# Patient Record
Sex: Female | Born: 1965 | Race: Black or African American | Hispanic: No | Marital: Single | Smoking: Never smoker
Health system: Southern US, Community
[De-identification: ages and names within clinical notes are randomized; demographics above are authoritative.]

## PROBLEM LIST (undated history)

## (undated) DIAGNOSIS — T783XXA Angioneurotic edema, initial encounter: Secondary | ICD-10-CM

## (undated) DIAGNOSIS — T7840XA Allergy, unspecified, initial encounter: Secondary | ICD-10-CM

## (undated) DIAGNOSIS — L509 Urticaria, unspecified: Secondary | ICD-10-CM

## (undated) HISTORY — PX: REDUCTION MAMMAPLASTY: SUR839

## (undated) HISTORY — DX: Allergy, unspecified, initial encounter: T78.40XA

## (undated) HISTORY — DX: Angioneurotic edema, initial encounter: T78.3XXA

## (undated) HISTORY — DX: Urticaria, unspecified: L50.9

---

## 1987-03-11 HISTORY — PX: BREAST REDUCTION SURGERY: SHX8

## 1991-03-11 HISTORY — PX: WISDOM TOOTH EXTRACTION: SHX21

## 2019-08-03 ENCOUNTER — Other Ambulatory Visit: Payer: Self-pay

## 2019-08-03 ENCOUNTER — Ambulatory Visit (INDEPENDENT_AMBULATORY_CARE_PROVIDER_SITE_OTHER): Payer: Self-pay | Admitting: Nurse Practitioner

## 2019-08-03 ENCOUNTER — Encounter: Payer: Self-pay | Admitting: Nurse Practitioner

## 2019-08-03 VITALS — BP 169/86 | HR 62 | Temp 98.5°F | Ht 62.0 in | Wt 268.4 lb

## 2019-08-03 DIAGNOSIS — Z0000 Encounter for general adult medical examination without abnormal findings: Secondary | ICD-10-CM | POA: Diagnosis not present

## 2019-08-03 DIAGNOSIS — R319 Hematuria, unspecified: Secondary | ICD-10-CM

## 2019-08-03 DIAGNOSIS — Z6841 Body Mass Index (BMI) 40.0 and over, adult: Secondary | ICD-10-CM

## 2019-08-03 DIAGNOSIS — R030 Elevated blood-pressure reading, without diagnosis of hypertension: Secondary | ICD-10-CM

## 2019-08-03 DIAGNOSIS — R7303 Prediabetes: Secondary | ICD-10-CM

## 2019-08-03 DIAGNOSIS — J301 Allergic rhinitis due to pollen: Secondary | ICD-10-CM

## 2019-08-03 DIAGNOSIS — R82998 Other abnormal findings in urine: Secondary | ICD-10-CM

## 2019-08-03 DIAGNOSIS — R198 Other specified symptoms and signs involving the digestive system and abdomen: Secondary | ICD-10-CM

## 2019-08-03 LAB — POCT URINALYSIS DIPSTICK
Bilirubin, UA: NEGATIVE
Glucose, UA: NEGATIVE
Ketones, UA: NEGATIVE
Nitrite, UA: NEGATIVE
Protein, UA: NEGATIVE
Spec Grav, UA: 1.02 (ref 1.010–1.025)
Urobilinogen, UA: 1 E.U./dL
pH, UA: 7 (ref 5.0–8.0)

## 2019-08-03 LAB — POCT GLYCOSYLATED HEMOGLOBIN (HGB A1C): Hemoglobin A1C: 6.1 % — AB (ref 4.0–5.6)

## 2019-08-03 LAB — GLUCOSE, POCT (MANUAL RESULT ENTRY): POC Glucose: 101 mg/dl — AB (ref 70–99)

## 2019-08-03 NOTE — Patient Instructions (Addendum)
   Managing Your Hypertension Hypertension is commonly called high blood pressure. This is when the force of your blood pressing against the walls of your arteries is too strong. Arteries are blood vessels that carry blood from your heart throughout your body. Hypertension forces the heart to work harder to pump blood, and may cause the arteries to become narrow or stiff. Having untreated or uncontrolled hypertension can cause heart attack, stroke, kidney disease, and other problems. What are blood pressure readings? A blood pressure reading consists of a higher number over a lower number. Ideally, your blood pressure should be below 120/80. The first ("top") number is called the systolic pressure. It is a measure of the pressure in your arteries as your heart beats. The second ("bottom") number is called the diastolic pressure. It is a measure of the pressure in your arteries as the heart relaxes. What does my blood pressure reading mean? Blood pressure is classified into four stages. Based on your blood pressure reading, your health care provider may use the following stages to determine what type of treatment you need, if any. Systolic pressure and diastolic pressure are measured in a unit called mm Hg. Normal  Systolic pressure: below 120.  Diastolic pressure: below 80. Elevated  Systolic pressure: 120-129.  Diastolic pressure: below 80. Hypertension stage 1  Systolic pressure: 130-139.  Diastolic pressure: 80-89. Hypertension stage 2  Systolic pressure: 140 or above.  Diastolic pressure: 90 or above. What health risks are associated with hypertension? Managing your hypertension is an important responsibility. Uncontrolled hypertension can lead to:  A heart attack.  A stroke.  A weakened blood vessel (aneurysm).  Heart failure.  Kidney damage.  Eye damage.  Metabolic syndrome.  Memory and concentration problems. What changes can I make to manage my  hypertension? Hypertension can be managed by making lifestyle changes and possibly by taking medicines. Your health care provider will help you make a plan to bring your blood pressure within a normal range. Eating and drinking   Eat a diet that is high in fiber and potassium, and low in salt (sodium), added sugar, and fat. An example eating plan is called the DASH (Dietary Approaches to Stop Hypertension) diet. To eat this way: ? Eat plenty of fresh fruits and vegetables. Try to fill half of your plate at each meal with fruits and vegetables. ? Eat whole grains, such as whole wheat pasta, brown rice, or whole grain bread. Fill about one quarter of your plate with whole grains. ? Eat low-fat diary products. ? Avoid fatty cuts of meat, processed or cured meats, and poultry with skin. Fill about one quarter of your plate with lean proteins such as fish, chicken without skin, beans, eggs, and tofu. ? Avoid premade and processed foods. These tend to be higher in sodium, added sugar, and fat.  Reduce your daily sodium intake. Most people with hypertension should eat less than 1,500 mg of sodium a day.  Limit alcohol intake to no more than 1 drink a day for nonpregnant women and 2 drinks a day for men. One drink equals 12 oz of beer, 5 oz of wine, or 1 oz of hard liquor. Lifestyle  Work with your health care provider to maintain a healthy body weight, or to lose weight. Ask what an ideal weight is for you.  Get at least 30 minutes of exercise that causes your heart to beat faster (aerobic exercise) most days of the week. Activities may include walking, swimming, or biking.    Include exercise to strengthen your muscles (resistance exercise), such as weight lifting, as part of your weekly exercise routine. Try to do these types of exercises for 30 minutes at least 3 days a week.  Do not use any products that contain nicotine or tobacco, such as cigarettes and e-cigarettes. If you need help quitting,  ask your health care provider.  Control any long-term (chronic) conditions you have, such as high cholesterol or diabetes. Monitoring  Monitor your blood pressure at home as told by your health care provider. Your personal target blood pressure may vary depending on your medical conditions, your age, and other factors.  Have your blood pressure checked regularly, as often as told by your health care provider. Working with your health care provider  Review all the medicines you take with your health care provider because there may be side effects or interactions.  Talk with your health care provider about your diet, exercise habits, and other lifestyle factors that may be contributing to hypertension.  Visit your health care provider regularly. Your health care provider can help you create and adjust your plan for managing hypertension. Will I need medicine to control my blood pressure? Your health care provider may prescribe medicine if lifestyle changes are not enough to get your blood pressure under control, and if:  Your systolic blood pressure is 130 or higher.  Your diastolic blood pressure is 80 or higher. Take medicines only as told by your health care provider. Follow the directions carefully. Blood pressure medicines must be taken as prescribed. The medicine does not work as well when you skip doses. Skipping doses also puts you at risk for problems. Contact a health care provider if:  You think you are having a reaction to medicines you have taken.  You have repeated (recurrent) headaches.  You feel dizzy.  You have swelling in your ankles.  You have trouble with your vision. Get help right away if:  You develop a severe headache or confusion.  You have unusual weakness or numbness, or you feel faint.  You have severe pain in your chest or abdomen.  You vomit repeatedly.  You have trouble breathing. Summary  Hypertension is when the force of blood pumping  through your arteries is too strong. If this condition is not controlled, it may put you at risk for serious complications.  Your personal target blood pressure may vary depending on your medical conditions, your age, and other factors. For most people, a normal blood pressure is less than 120/80.  Hypertension is managed by lifestyle changes, medicines, or both. Lifestyle changes include weight loss, eating a healthy, low-sodium diet, exercising more, and limiting alcohol. This information is not intended to replace advice given to you by your health care provider. Make sure you discuss any questions you have with your health care provider. Document Revised: 06/18/2018 Document Reviewed: 01/23/2016 Elsevier Patient Education  2020 [employer]vier Inc.  

## 2019-08-03 NOTE — Progress Notes (Signed)
[facility] Patient [facility] [address] [place], [place]  [address] Phone:  [phone]   Fax:  [phone]   New Patient Office Visit  Subjective:  Patient ID: Erica Ferguson, female    DOB: February 21, 1966  Age: 54 y.o. MRN: [id number]  CC:  Chief Complaint  Patient presents with  . New Patient (Initial Visit)    Est care    HPI Erica Ferguson presents to establish care. She  has no past medical history on file.  She moved her from [place] just prior to the pandemic. She is up-to-date on Mammogram and Pap smear completed just prior to her move. She admits that she is not on any medications. She has recently started allergy medication do to the change in environment.   Hypertension Patient has elevated blood pressure. She is exercising and is adherent to a low-salt diet. She has lost 8 pounds so she is improving. She has a goal of being under 200 pounds. Blood pressure is well controlled at home. Cardiac symptoms: exertional chest pressure/discomfort. She has shortness of breath with exercises. She has a history of plantar fascitis. She is doing better.Patient denies claudication, fatigue, irregular heart beat, lower extremity edema, orthopnea and syncope. Cardiovascular risk factors: obesity (BMI >= 30 kg/m2) and sedentary lifestyle. Use of agents associated with hypertension: none. History of target organ damage: none. She wears glasses.    Constipation Patient complains of constipation. Onset was several months  Co-Morbid conditions:obesity. Symptoms have been intermittent.She has some fullness. She admits that she can have fullness with and without meals.  Current Health Habits: Eating fiber? yes - , Exercise? yes - . Adequate hydration? yes - . Current over the counter/prescription laxative: which has been not very effective. She denies colonoscopy. She admits that TSH has been normal.    No past medical history on file.  Past Surgical History:  Procedure Laterality Date  .  BREAST REDUCTION SURGERY  1989    Family History  Problem Relation Age of Onset  . Kidney failure Mother   . Hypertension Mother     Social History   Socioeconomic History  . Marital status: Single    Spouse name: Not on file  . Number of children: Not on file  . Years of education: Not on file  . Highest education level: Not on file  Occupational History  . Not on file  Tobacco Use  . Smoking status: Never Smoker  . Smokeless tobacco: Never Used  Substance and Sexual Activity  . Alcohol use: Not Currently  . Drug use: Never  . Sexual activity: Not Currently  Other Topics Concern  . Not on file  Social History Narrative  . Not on file   Social Determinants of Health   Financial Resource Strain:   . Difficulty of Paying Living Expenses:   Food Insecurity:   . Worried About Programme researcher, broadcasting/film/video in the Last Year:   . Barista in the Last Year:   Transportation Needs:   . Freight forwarder (Medical):   Marland Kitchen Lack of Transportation (Non-Medical):   Physical Activity:   . Days of Exercise per Week:   . Minutes of Exercise per Session:   Stress:   . Feeling of Stress :   Social Connections:   . Frequency of Communication with Friends and Family:   . Frequency of Social Gatherings with Friends and Family:   . Attends Religious Services:   . Active Member of  Clubs or Organizations:   . Attends Archivist Meetings:   Marland Kitchen Marital Status:   Intimate Partner Violence:   . Fear of Current or Ex-Partner:   . Emotionally Abused:   Marland Kitchen Physically Abused:   . Sexually Abused:     ROS Review of Systems  All other systems reviewed and are negative.   Objective:   Today's Vitals: BP (!) 169/86   Pulse 62   Temp 98.5 F (36.9 C)   Ht 5\' 2"  (1.575 m)   Wt 268 lb 6.4 oz (121.7 kg)   SpO2 100%   BMI 49.09 kg/m   Physical Exam Constitutional:      General: She is not in acute distress.    Appearance: She is obese. She is not ill-appearing or  toxic-appearing.  HENT:     Head: Normocephalic and atraumatic.     Nose: Nose normal.     Mouth/Throat:     Mouth: Mucous membranes are moist.     Pharynx: Oropharynx is clear.  Cardiovascular:     Rate and Rhythm: Normal rate and regular rhythm.     Pulses: Normal pulses.     Heart sounds: Normal heart sounds.  Pulmonary:     Effort: Pulmonary effort is normal.     Breath sounds: Normal breath sounds.  Abdominal:     Comments: hypoactive  Musculoskeletal:     Cervical back: Normal range of motion.  Skin:    General: Skin is warm and dry.     Capillary Refill: Capillary refill takes less than 2 seconds.  Neurological:     General: No focal deficit present.     Mental Status: She is alert and oriented to person, place, and time.  Psychiatric:        Mood and Affect: Mood normal.        Behavior: Behavior normal.        Thought Content: Thought content normal.        Judgment: Judgment normal.     Assessment & Plan:   Problem List Items Addressed This Visit    None    Visit Diagnoses    Morbid obesity with BMI of 45.0-49.9, adult ([place])    -  Primary   Lifestyle modification   Health care maintenance       GI referrral for colonoscopy   Relevant Orders   POCT urinalysis dipstick (Completed)   POCT glycosylated hemoglobin (Hb A1C) (Completed)   POCT glucose (manual entry) (Completed)   Ambulatory referral to Gastroenterology   CBC with Differential/Platelet (Completed)   Comp. Metabolic Panel (12) (Completed)   Lipid panel (Completed)   TSH (Completed)   Vitamin B12 (Completed)   Magnesium (Completed)   Prediabetes       A1C  Lifestyle modification  Written education provided   Elevated blood pressure reading       Patient to monitor  Continue weight loss and exercise    Leukocytes in urine       Relevant Orders   Urine Culture   Hematuria, unspecified type       Urine culture pending Will continue to monitor   Seasonal allergic rhinitis due to pollen        Continue current regimen Changes available if needed      Outpatient Encounter Medications as of 08/03/2019  Medication Sig  . loratadine (CLARITIN) 10 MG tablet Take 10 mg by mouth daily.  . vitamin C (ASCORBIC ACID) 250 MG tablet Take 250 mg  by mouth daily.   No facility-administered encounter medications on file as of 08/03/2019.    Follow-up: Return in about 3 months (around 11/03/2019) for fasting labs within the next week.   Barbette Merino, NP

## 2019-08-04 LAB — VITAMIN B12: Vitamin B-12: 175 pg/mL — ABNORMAL LOW (ref 232–1245)

## 2019-08-04 LAB — LIPID PANEL
Chol/HDL Ratio: 4 ratio (ref 0.0–4.4)
Cholesterol, Total: 185 mg/dL (ref 100–199)
HDL: 46 mg/dL (ref >39)
LDL Chol Calc (NIH): 122 mg/dL — ABNORMAL HIGH (ref 0–99)
Triglycerides: 94 mg/dL (ref 0–149)
VLDL Cholesterol Cal: 17 mg/dL (ref 5–40)

## 2019-08-04 LAB — COMP. METABOLIC PANEL (12)
AST: 13 IU/L (ref 0–40)
Albumin/Globulin Ratio: 1.1 — ABNORMAL LOW (ref 1.2–2.2)
Albumin: 4 g/dL (ref 3.8–4.9)
Alkaline Phosphatase: 63 IU/L (ref 48–121)
BUN/Creatinine Ratio: 10 (ref 9–23)
BUN: 9 mg/dL (ref 6–24)
Bilirubin Total: 0.5 mg/dL (ref 0.0–1.2)
Calcium: 9.2 mg/dL (ref 8.7–10.2)
Chloride: 101 mmol/L (ref 96–106)
Creatinine, Ser: 0.89 mg/dL (ref 0.57–1.00)
GFR calc Af Amer: 84 mL/min/{1.73_m2} (ref >59)
GFR calc non Af Amer: 73 mL/min/{1.73_m2} (ref >59)
Globulin, Total: 3.5 g/dL (ref 1.5–4.5)
Glucose: 92 mg/dL (ref 65–99)
Potassium: 4.2 mmol/L (ref 3.5–5.2)
Sodium: 138 mmol/L (ref 134–144)
Total Protein: 7.5 g/dL (ref 6.0–8.5)

## 2019-08-04 LAB — CBC WITH DIFFERENTIAL/PLATELET
Basophils Absolute: 0.1 10*3/uL (ref 0.0–0.2)
Basos: 1 %
EOS (ABSOLUTE): 0.1 10*3/uL (ref 0.0–0.4)
Eos: 2 %
Hematocrit: 42.5 % (ref 34.0–46.6)
Hemoglobin: 14.5 g/dL (ref 11.1–15.9)
Immature Grans (Abs): 0 10*3/uL (ref 0.0–0.1)
Immature Granulocytes: 0 %
Lymphocytes Absolute: 3 10*3/uL (ref 0.7–3.1)
Lymphs: 43 %
MCH: 30.3 pg (ref 26.6–33.0)
MCHC: 34.1 g/dL (ref 31.5–35.7)
MCV: 89 fL (ref 79–97)
Monocytes Absolute: 0.5 10*3/uL (ref 0.1–0.9)
Monocytes: 7 %
Neutrophils Absolute: 3.2 10*3/uL (ref 1.4–7.0)
Neutrophils: 47 %
Platelets: 299 10*3/uL (ref 150–450)
RBC: 4.79 x10E6/uL (ref 3.77–5.28)
RDW: 13.3 % (ref 11.7–15.4)
WBC: 6.9 10*3/uL (ref 3.4–10.8)

## 2019-08-04 LAB — MAGNESIUM: Magnesium: 2.2 mg/dL (ref 1.6–2.3)

## 2019-08-04 LAB — TSH: TSH: 0.779 u[IU]/mL (ref 0.450–4.500)

## 2019-08-04 NOTE — Progress Notes (Signed)
Labs overall are within normal range. Vitamin B12 insuffiencey recommend daily otc vit b12 will reevaluate in 3 mons. Continue with lifestyle modifications.

## 2019-08-05 LAB — URINE CULTURE

## 2019-08-12 ENCOUNTER — Other Ambulatory Visit: Payer: Self-pay

## 2019-09-06 ENCOUNTER — Encounter: Payer: Self-pay | Admitting: Internal Medicine

## 2019-10-05 ENCOUNTER — Other Ambulatory Visit: Payer: Self-pay

## 2019-10-05 ENCOUNTER — Ambulatory Visit (AMBULATORY_SURGERY_CENTER): Payer: Self-pay

## 2019-10-05 VITALS — Ht 62.0 in | Wt 270.0 lb

## 2019-10-05 DIAGNOSIS — Z01818 Encounter for other preprocedural examination: Secondary | ICD-10-CM

## 2019-10-05 DIAGNOSIS — Z1211 Encounter for screening for malignant neoplasm of colon: Secondary | ICD-10-CM

## 2019-10-05 MED ORDER — NA SULFATE-K SULFATE-MG SULF 17.5-3.13-1.6 GM/177ML PO SOLN: 1.0000 | Freq: Once | ORAL | 0 refills | Status: AC

## 2019-10-05 MED FILL — SUPREP BOWEL PREP KIT: 17.5-3.13-1 | 2 days supply | Qty: 354 | Fill #0

## 2019-10-05 NOTE — Progress Notes (Signed)
No egg or soy allergy known to patient  No issues with past sedation with any surgeries or procedures No intubation problems in the past  No diet pills per patient No home 02 use per patient  No blood thinners per patient  Pt HAS issues with constipation- No A fib or A flutter  EMMI video to pt or MyChart  COVID 19 guidelines implemented in [place] today   COVID screening scheduled on 10/25/2019 at 8:00 am;  Due to the COVID-19 pandemic we are asking patients to follow these guidelines. Please only bring one care partner. Please be aware that your care partner may wait in the car in the parking lot or if they feel like they will be too hot to wait in the car, they may wait in the lobby on the 4th floor. All care partners are required to wear a mask the entire time (we do not have any that we can provide them), they need to practice social distancing, and we will do a Covid check for all patient's and care partners when you arrive. Also we will check their temperature and your temperature. If the care partner waits in their car they need to stay in the parking lot the entire time and we will call them on their cell phone when the patient is ready for discharge so they can bring the car to the front of the building. Also all patient's will need to wear a mask into building.

## 2019-10-06 ENCOUNTER — Encounter: Payer: Self-pay | Admitting: Internal Medicine

## 2019-10-21 MED FILL — SUPREP BOWEL PREP KIT: 17.5-3.13-1 | 2 days supply | Qty: 354 | Fill #0

## 2019-10-21 MED FILL — SUPREP BOWEL PREP KIT: 17.5-3.13-1 | 2 days supply | Qty: 354 | Fill #0 | Status: TO

## 2019-10-25 ENCOUNTER — Other Ambulatory Visit: Payer: Self-pay

## 2019-10-25 ENCOUNTER — Other Ambulatory Visit: Payer: Self-pay | Admitting: Internal Medicine

## 2019-10-25 ENCOUNTER — Ambulatory Visit (INDEPENDENT_AMBULATORY_CARE_PROVIDER_SITE_OTHER): Payer: 59

## 2019-10-25 DIAGNOSIS — Z1159 Encounter for screening for other viral diseases: Secondary | ICD-10-CM

## 2019-10-25 LAB — SARS CORONAVIRUS 2 (TAT 6-24 HRS): SARS Coronavirus 2: NEGATIVE

## 2019-10-27 ENCOUNTER — Encounter: Payer: Self-pay | Admitting: Internal Medicine

## 2019-10-27 ENCOUNTER — Other Ambulatory Visit: Payer: Self-pay

## 2019-10-27 ENCOUNTER — Ambulatory Visit (AMBULATORY_SURGERY_CENTER): Payer: 59 | Admitting: Internal Medicine

## 2019-10-27 VITALS — BP 152/70 | HR 48 | Temp 98.0°F | Resp 16 | Ht 62.0 in | Wt 270.0 lb

## 2019-10-27 DIAGNOSIS — Z1211 Encounter for screening for malignant neoplasm of colon: Secondary | ICD-10-CM | POA: Diagnosis not present

## 2019-10-27 MED ORDER — SODIUM CHLORIDE 0.9 % IV SOLN: 500.0000 mL | Freq: Once | INTRAVENOUS | Status: DC

## 2019-10-27 NOTE — Op Note (Signed)
[facility] Patient Name: Erica Ferguson Procedure Date: 10/27/2019 1:31 PM MRN: [id number] Endoscopist: Wilhemina Bonito. Marina Goodell , MD Age: 54 Referring MD:  Date of Birth: Oct 13, 1965 Gender: Female Account #: [id number] Procedure:                Colonoscopy Indications:              Screening for colorectal malignant neoplasm Medicines:                Monitored Anesthesia Care Procedure:                Pre-Anesthesia Assessment:                           - Prior to the procedure, a History and Physical                            was performed, and patient medications and                            allergies were reviewed. The patient's tolerance of                            previous anesthesia was also reviewed. The risks                            and benefits of the procedure and the sedation                            options and risks were discussed with the patient.                            All questions were answered, and informed consent                            was obtained. Prior Anticoagulants: The patient has                            taken no previous anticoagulant or antiplatelet                            agents. ASA Grade Assessment: II - A patient with                            mild systemic disease. After reviewing the risks                            and benefits, the patient was deemed in                            satisfactory condition to undergo the procedure.                           After obtaining informed consent, the colonoscope  was passed under direct vision. Throughout the                            procedure, the patient's blood pressure, pulse, and                            oxygen saturations were monitored continuously. The                            Colonoscope was introduced through the anus and                            advanced to the the cecum, identified by                            appendiceal orifice and  ileocecal valve. The                            ileocecal valve, appendiceal orifice, and rectum                            were photographed. The quality of the bowel                            preparation was excellent. The colonoscopy was                            performed without difficulty. The patient tolerated                            the procedure well. The bowel preparation used was                            SUPREP via split dose instruction. Scope In: 1:58:46 PM Scope Out: 2:13:51 PM Scope Withdrawal Time: 0 hours 12 minutes 58 seconds  Total Procedure Duration: 0 hours 15 minutes 5 seconds  Findings:                 The terminal ileum appeared normal.                           A few medium-mouthed diverticula were found in the                            sigmoid colon.                           Internal hemorrhoids were found during                            retroflexion. The hemorrhoids were small.                           The exam was otherwise without abnormality on  direct and retroflexion views. Complications:            No immediate complications. Estimated blood loss:                            None. Estimated Blood Loss:     Estimated blood loss: none. Impression:               - The examined portion of the ileum was normal.                           - Diverticulosis in the sigmoid colon.                           - Internal hemorrhoids.                           - The examination was otherwise normal on direct                            and retroflexion views.                           - No specimens collected. Recommendation:           - Repeat colonoscopy in 10 years for screening                            purposes.                           - Patient has a contact number available for                            emergencies. The signs and symptoms of potential                            delayed complications were discussed with the                             patient. Return to normal activities tomorrow.                            Written discharge instructions were provided to the                            patient.                           - Resume previous diet.                           - Continue present medications. Wilhemina Bonito. Marina Goodell, MD 10/27/2019 2:23:09 PM This report has been signed electronically.

## 2019-10-27 NOTE — Patient Instructions (Signed)
YOU HAD AN ENDOSCOPIC PROCEDURE TODAY AT THE [facility]:   Refer to the procedure report that was given to you for any specific questions about what was found during the examination.  If the procedure report does not answer your questions, please call your gastroenterologist to clarify.  If you requested that your care partner not be given the details of your procedure findings, then the procedure report has been included in a sealed envelope for you to review at your convenience later. ° °YOU SHOULD EXPECT: Some feelings of bloating in the abdomen. Passage of more gas than usual.  Walking can help get rid of the air that was put into your GI tract during the procedure and reduce the bloating. If you had a lower endoscopy (such as a colonoscopy or flexible sigmoidoscopy) you may notice spotting of blood in your stool or on the toilet paper. If you underwent a bowel prep for your procedure, you may not have a normal bowel movement for a few days. ° °Please Note:  You might notice some irritation and congestion in your nose or some drainage.  This is from the oxygen used during your procedure.  There is no need for concern and it should clear up in a day or so. ° °SYMPTOMS TO REPORT IMMEDIATELY: ° °Following lower endoscopy (colonoscopy or flexible sigmoidoscopy): ° Excessive amounts of blood in the stool ° Significant tenderness or worsening of abdominal pains ° Swelling of the abdomen that is new, acute ° Fever of 100°F or higher ° °For urgent or emergent issues, a gastroenterologist can be reached at any hour by calling [phone]. °Do not use MyChart messaging for urgent concerns.  ° ° °DIET:  We do recommend a small meal at first, but then you may proceed to your regular diet.  Drink plenty of fluids but you should avoid alcoholic beverages for 24 hours. ° °ACTIVITY:  You should plan to take it easy for the rest of today and you should NOT DRIVE or use heavy machinery until tomorrow (because of  the sedation medicines used during the test).   ° °FOLLOW UP: °Our staff will call the number listed on your records 48-72 hours following your procedure to check on you and address any questions or concerns that you may have regarding the information given to you following your procedure. If we do not reach you, we will leave a message.  We will attempt to reach you two times.  During this call, we will ask if you have developed any symptoms of COVID 19. If you develop any symptoms (ie: fever, flu-like symptoms, shortness of breath, cough etc.) before then, please call [phone].  If you test positive for Covid 19 in the 2 weeks post procedure, please call and report this information to us.   ° °SIGNATURES/CONFIDENTIALITY: °You and/or your care partner have signed paperwork which will be entered into your electronic medical record.  These signatures attest to the fact that that the information above on your After Visit Summary has been reviewed and is understood.  Full responsibility of the confidentiality of this discharge information lies with you and/or your care-partner.  °

## 2019-10-27 NOTE — Progress Notes (Signed)
PT taken to PACU. Monitors in place. VSS. Report given to RN. 

## 2019-10-31 ENCOUNTER — Telehealth: Payer: Self-pay

## 2019-10-31 NOTE — Telephone Encounter (Signed)
  Follow up Call-  Call back number 10/27/2019  Post procedure Call Back phone  # [phone]  Permission to leave phone message No     Patient questions:  Do you have a fever, pain , or abdominal swelling? No. Pain Score  0 *  Have you tolerated food without any problems? Yes.    Have you been able to return to your normal activities? Yes.    Do you have any questions about your discharge instructions: Diet   No. Medications  No. Follow up visit  No.  Do you have questions or concerns about your Care? No.  Actions: * If pain score is 4 or above: No action needed, pain <4.  1. Have you developed a fever since your procedure? no  2.   Have you had an respiratory symptoms (SOB or cough) since your procedure? no  3.   Have you tested positive for COVID 19 since your procedure no  4.   Have you had any family members/close contacts diagnosed with the COVID 19 since your procedure?  no   If yes to any of these questions please route to Laverna Peace, RN and Karlton Lemon, RN

## 2019-11-03 ENCOUNTER — Ambulatory Visit: Payer: Self-pay | Admitting: Nurse Practitioner

## 2019-11-07 ENCOUNTER — Ambulatory Visit: Payer: Self-pay | Admitting: Nurse Practitioner

## 2019-11-21 ENCOUNTER — Ambulatory Visit (INDEPENDENT_AMBULATORY_CARE_PROVIDER_SITE_OTHER): Payer: 59 | Admitting: Nurse Practitioner

## 2019-11-21 ENCOUNTER — Encounter: Payer: Self-pay | Admitting: Nurse Practitioner

## 2019-11-21 ENCOUNTER — Other Ambulatory Visit: Payer: Self-pay

## 2019-11-21 VITALS — BP 154/75 | HR 56 | Temp 97.4°F | Ht 62.0 in | Wt 272.0 lb

## 2019-11-21 DIAGNOSIS — Z6841 Body Mass Index (BMI) 40.0 and over, adult: Secondary | ICD-10-CM

## 2019-11-21 DIAGNOSIS — Z1231 Encounter for screening mammogram for malignant neoplasm of breast: Secondary | ICD-10-CM

## 2019-11-21 DIAGNOSIS — R7303 Prediabetes: Secondary | ICD-10-CM | POA: Diagnosis not present

## 2019-11-21 DIAGNOSIS — R030 Elevated blood-pressure reading, without diagnosis of hypertension: Secondary | ICD-10-CM

## 2019-11-21 NOTE — Patient Instructions (Signed)
Vitamin B12 Deficiency Vitamin B12 deficiency means that your body does not have enough vitamin B12. The body needs this vitamin:  To make red blood cells.  To make genes (DNA).  To help the nerves work. If you do not have enough vitamin B12 in your body, you can have health problems. What are the causes?  Not eating enough foods that contain vitamin B12.  Not being able to absorb vitamin B12 from the food that you eat.  Certain digestive system diseases.  A condition in which the body does not make enough of a certain protein, which results in too few red blood cells (pernicious anemia).  Having a surgery in which part of the stomach or small intestine is removed.  Taking medicines that make it hard for the body to absorb vitamin B12. These medicines include: ? Heartburn medicines. ? Some antibiotic medicines. ? Other medicines that are used to treat certain conditions. What increases the risk?  Being older than age 50.  Eating a vegetarian or vegan diet, especially while you are pregnant.  Eating a poor diet while you are pregnant.  Taking certain medicines.  Having alcoholism. What are the signs or symptoms? In some cases, there are no symptoms. If the condition leads to too few blood cells or nerve damage, symptoms can occur, such as:  Feeling weak.  Feeling tired (fatigued).  Not being hungry.  Weight loss.  A loss of feeling (numbness) or tingling in your hands and feet.  Redness and burning of the tongue.  Being mixed up (confused) or having memory problems.  Sadness (depression).  Problems with your senses. This can include color blindness, ringing in the ears, or loss of taste.  Watery poop (diarrhea) or trouble pooping (constipation).  Trouble walking. If anemia is very bad, symptoms can include:  Being short of breath.  Being dizzy.  Having a very fast heartbeat. How is this treated?  Changing the way you eat and drink, such  as: ? Eating more foods that contain vitamin B12. ? Drinking little or no alcohol.  Getting vitamin B12 shots.  Taking vitamin B12 supplements. Your doctor will tell you the dose that is best for you. Follow these instructions at home: Eating and drinking   Eat lots of healthy foods that contain vitamin B12. These include: ? Meats and poultry, such as beef, pork, chicken, turkey, and organ meats, such as liver. ? Seafood, such as clams, rainbow trout, salmon, tuna, and haddock. ? Eggs. ? Cereal and dairy products that have vitamin B12 added to them. Check the label. The items listed above may not be a complete list of what you can eat and drink. Contact a dietitian for more options. General instructions  Get any shots as told by your doctor.  Take supplements only as told by your doctor.  Do not drink alcohol if your doctor tells you not to. In some cases, you may only be asked to limit alcohol use.  Keep all follow-up visits as told by your doctor. This is important. Contact a doctor if:  Your symptoms come back. Get help right away if:  You have trouble breathing.  You have a very fast heartbeat.  You have chest pain.  You get dizzy.  You pass out. Summary  Vitamin B12 deficiency means that your body is not getting enough vitamin B12.  In some cases, there are no symptoms of this condition.  Treatment may include making a change in the way you eat and drink,   getting vitamin B12 shots, or taking supplements.  Eat lots of healthy foods that contain vitamin B12. This information is not intended to replace advice given to you by your health care provider. Make sure you discuss any questions you have with your health care provider. Document Revised: 11/03/2017 Document Reviewed: 11/03/2017 Elsevier Patient Education  2020 [employer]vier Inc[employer]  

## 2019-11-21 NOTE — Progress Notes (Addendum)
[facility] [address] [place], [place]  [address] Phone:  [phone]   Fax:  [phone]    Established Patient Office Visit  Subjective:  Patient ID: Erica Ferguson, female    DOB: [birth year]  Age: 54 y.o. MRN: [id number]  CC:  Chief Complaint  Patient presents with  . Follow-up    HPI Erica Ferguson presents for follow up. She  has a past medical history of Allergy.   Hypertension Patient is here for follow-up of elevated blood pressure. She is not exercising and is not adherent to a low-salt diet.  Her weight has increased 2 pounds.  She admits that she has not been doing well due to stress.  Blood pressure is not monitored at home. Cardiac symptoms: none. Patient denies chest pain, chest pressure/discomfort, exertional chest pressure/discomfort, fatigue, irregular heart beat, lower extremity edema, palpitations and syncope. Cardiovascular risk factors: obesity (BMI >= 30 kg/m2) and sedentary lifestyle. Use of agents associated with hypertension: none. History of target organ damage: none. Patient has prediabetes and is aware that she needs to make lifestyle modification to avoid diabetes.   Past Medical History:  Diagnosis Date  . Allergy    seasonal allergies    Past Surgical History:  Procedure Laterality Date  . BREAST REDUCTION SURGERY  1989  . WISDOM TOOTH EXTRACTION Bilateral 1993    Family History  Problem Relation Age of Onset  . Kidney failure Mother   . Hypertension Mother   . Colon polyps Neg Hx   . Colon cancer Neg Hx   . Esophageal cancer Neg Hx   . Rectal cancer Neg Hx   . Stomach cancer Neg Hx     Social History   Socioeconomic History  . Marital status: Single    Spouse name: Not on file  . Number of children: Not on file  . Years of education: Not on file  . Highest education level: Not on file  Occupational History  . Not on file  Tobacco Use  . Smoking status: Never Smoker  . Smokeless tobacco: Never Used    Vaping Use  . Vaping Use: Never used  Substance and Sexual Activity  . Alcohol use: Not Currently  . Drug use: Never  . Sexual activity: Not Currently  Other Topics Concern  . Not on file  Social History Narrative  . Not on file   Social Determinants of Health   Financial Resource Strain:   . Difficulty of Paying Living Expenses: Not on file  Food Insecurity:   . Worried About Programme researcher, broadcasting/film/video in the Last Year: Not on file  . Ran Out of Food in the Last Year: Not on file  Transportation Needs:   . Lack of Transportation (Medical): Not on file  . Lack of Transportation (Non-Medical): Not on file  Physical Activity:   . Days of Exercise per Week: Not on file  . Minutes of Exercise per Session: Not on file  Stress:   . Feeling of Stress : Not on file  Social Connections:   . Frequency of Communication with Friends and Family: Not on file  . Frequency of Social Gatherings with Friends and Family: Not on file  . Attends Religious Services: Not on file  . Active Member of Clubs or Organizations: Not on file  . Attends Banker Meetings: Not on file  . Marital Status: Not on file  Intimate Partner Violence:   . Fear of Current  or Ex-Partner: Not on file  . Emotionally Abused: Not on file  . Physically Abused: Not on file  . Sexually Abused: Not on file    Outpatient Medications Prior to Visit  Medication Sig Dispense Refill  . loratadine (CLARITIN) 10 MG tablet Take 10 mg by mouth daily.     . Cyanocobalamin (VITAMIN B-12 PO) Take by mouth. (Patient not taking: Reported on 10/27/2019)    . Naproxen Sodium (ALEVE PO) Take by mouth as needed.    . vitamin C (ASCORBIC ACID) 250 MG tablet Take 250 mg by mouth daily. (Patient not taking: Reported on 10/27/2019)     No facility-administered medications prior to visit.    No Known Allergies  ROS Review of Systems  All other systems reviewed and are negative.     Objective:    Physical Exam Constitutional:       Appearance: She is obese.  HENT:     Head: Normocephalic and atraumatic.     Nose: Nose normal.     Mouth/Throat:     Mouth: Mucous membranes are moist.  Cardiovascular:     Rate and Rhythm: Normal rate and regular rhythm.     Pulses: Normal pulses.     Heart sounds: Normal heart sounds.  Pulmonary:     Effort: Pulmonary effort is normal.     Breath sounds: Normal breath sounds.  Abdominal:     Palpations: Abdomen is soft.  Musculoskeletal:     Cervical back: Normal range of motion.     Comments: Trace edema bilaterally  Skin:    General: Skin is warm and dry.     Capillary Refill: Capillary refill takes less than 2 seconds.  Neurological:     General: No focal deficit present.     Mental Status: She is alert and oriented to person, place, and time.  Psychiatric:        Mood and Affect: Mood normal.        Behavior: Behavior normal.        Thought Content: Thought content normal.     BP (!) 154/75   Pulse (!) 56   Temp (!) 97.4 F (36.3 C) (Temporal)   Ht 5\' 2"  (1.575 m)   Wt 272 lb (123.4 kg)   SpO2 100%   BMI 49.75 kg/m  Wt Readings from Last 3 Encounters:  11/21/19 272 lb (123.4 kg)  10/27/19 270 lb (122.5 kg)  10/05/19 (!) 270 lb (122.5 kg)     Health Maintenance Due  Topic Date Due  . Hepatitis C Screening  Never done  . HIV Screening  Never done  . MAMMOGRAM  Never done  . INFLUENZA VACCINE  Never done    There are no preventive care reminders to display for this patient.  Lab Results  Component Value Date   TSH 0.779 08/03/2019   Lab Results  Component Value Date   WBC 6.9 08/03/2019   HGB 14.5 08/03/2019   HCT 42.5 08/03/2019   MCV 89 08/03/2019   PLT 299 08/03/2019   Lab Results  Component Value Date   NA 138 08/03/2019   K 4.2 08/03/2019   GLUCOSE 92 08/03/2019   BUN 9 08/03/2019   CREATININE 0.89 08/03/2019   BILITOT 0.5 08/03/2019   ALKPHOS 63 08/03/2019   AST 13 08/03/2019   PROT 7.5 08/03/2019   ALBUMIN 4.0 08/03/2019    CALCIUM 9.2 08/03/2019   Lab Results  Component Value Date   CHOL 185 08/03/2019  Lab Results  Component Value Date   HDL 46 08/03/2019   Lab Results  Component Value Date   LDLCALC 122 (H) 08/03/2019   Lab Results  Component Value Date   TRIG 94 08/03/2019   Lab Results  Component Value Date   CHOLHDL 4.0 08/03/2019   Lab Results  Component Value Date   HGBA1C 6.1 (A) 08/03/2019      Assessment & Plan:   Problem List Items Addressed This Visit    None    Visit Diagnoses    Screening mammogram, encounter for    - Patient to follow up with well woman visit for clinical breast exam and Pap test   Relevant Orders   MM Digital Screening   Morbid obesity with BMI of 45.0-49.9, adult (HCC)     Obesity with BMI as noted above.  Discussed proper diet (low fat, low sodium, high fiber) with patient.  Discussed need for regular exercise (3 times per week, 20 minutes per session) with patient. We discussed preplanning meals to help with making better choices when you are stressed or working full-time   Elevated blood pressure reading    Encouraged on going compliance with current medication regimen Encouraged home monitoring and recording BP <130/80 Eating a heart-healthy diet with less salt Encouraged regular physical activity  Recommend Weight loss    Prediabetes   Consider home glucose monitoring Weight loss at least 5% of current body weight is can be achieved with lifestyle modification dietary changes and regular daily exercise Encourage blood pressure control goal <120/80 and maintaining total cholesterol <200 Follow-up every 3 to 6 months for reevaluation         No orders of the defined types were placed in this encounter.   Follow-up: Return for well woman physcial may schdule at 4 pm if needed she works its ok.    Barbette Merino, NP

## 2019-11-25 ENCOUNTER — Other Ambulatory Visit: Payer: Self-pay | Admitting: Nurse Practitioner

## 2019-11-25 MED ORDER — TRIAMCINOLONE ACETONIDE 0.1 % EX CREA: 1.0000 "application " | TOPICAL_CREAM | Freq: Two times a day (BID) | CUTANEOUS | 2 refills | Status: DC

## 2020-01-03 ENCOUNTER — Ambulatory Visit
Admission: RE | Admit: 2020-01-03 | Discharge: 2020-01-03 | Disposition: A | Discharge status: Home / Self Care | Payer: 59 | Source: Ambulatory Visit | Attending: Nurse Practitioner | Admitting: Nurse Practitioner

## 2020-01-03 ENCOUNTER — Other Ambulatory Visit: Payer: Self-pay

## 2020-01-03 DIAGNOSIS — Z1231 Encounter for screening mammogram for malignant neoplasm of breast: Secondary | ICD-10-CM | POA: Diagnosis not present

## 2020-01-06 DIAGNOSIS — H5211 Myopia, right eye: Secondary | ICD-10-CM | POA: Diagnosis not present

## 2020-01-06 DIAGNOSIS — H52223 Regular astigmatism, bilateral: Secondary | ICD-10-CM | POA: Diagnosis not present

## 2020-01-06 DIAGNOSIS — H524 Presbyopia: Secondary | ICD-10-CM | POA: Diagnosis not present

## 2020-02-23 ENCOUNTER — Telehealth (INDEPENDENT_AMBULATORY_CARE_PROVIDER_SITE_OTHER): Payer: 59 | Admitting: Nurse Practitioner

## 2020-02-23 ENCOUNTER — Telehealth: Payer: Self-pay

## 2020-02-23 DIAGNOSIS — T7840XA Allergy, unspecified, initial encounter: Secondary | ICD-10-CM

## 2020-02-23 DIAGNOSIS — R0600 Dyspnea, unspecified: Secondary | ICD-10-CM | POA: Diagnosis not present

## 2020-02-23 DIAGNOSIS — R059 Cough, unspecified: Secondary | ICD-10-CM

## 2020-02-23 DIAGNOSIS — R0609 Other forms of dyspnea: Secondary | ICD-10-CM

## 2020-02-23 DIAGNOSIS — R0981 Nasal congestion: Secondary | ICD-10-CM

## 2020-02-23 MED ORDER — BENZONATATE 100 MG PO CAPS: 100.0000 mg | ORAL_CAPSULE | Freq: Three times a day (TID) | ORAL | 0 refills | Status: AC | PRN

## 2020-02-23 MED ORDER — ALBUTEROL SULFATE HFA 108 (90 BASE) MCG/ACT IN AERS: 2.0000 | INHALATION_SPRAY | Freq: Four times a day (QID) | RESPIRATORY_TRACT | 2 refills | Status: DC | PRN

## 2020-02-23 MED ORDER — AMOXICILLIN-POT CLAVULANATE 875-125 MG PO TABS: 1.0000 | ORAL_TABLET | Freq: Two times a day (BID) | ORAL | 0 refills | Status: AC

## 2020-02-23 NOTE — Telephone Encounter (Signed)
Contacted pt and scheduled for virtual visit w/ PCP today.

## 2020-02-23 NOTE — Progress Notes (Signed)
   [facility] Patient [facility] [address], [place]  [address] Phone:  [phone]   Fax:  [phone] Virtual Visit via Telephone Note  I connected with Erica Ferguson on 02/23/20 at  3:20 PM EST by telephone and verified that I am speaking with the correct person using two identifiers.  Location: Patient: home Provider: office   I discussed the limitations, risks, security and privacy concerns of performing an evaluation and management service by telephone and the availability of in person appointments. I also discussed with the patient that there may be a patient responsible charge related to this service. The patient expressed understanding and agreed to proceed.   History of Present Illness:  Cough Patient complains of productive cough with sputum described as white and mucoid. Symptoms began several weeks ago. Symptoms have been gradually worsening since that time.The cough is barky and is aggravated by nothing. Associated symptoms include: shortness of breath and headache and nasal congestion..she denies any wheezing.  Patient does not have new pets. Patient does not have a history of asthma. Patient does have a history of environmental allergens. Patient has not traveled recently. Patient does have a history of smoking. Patient has not had a previous chest x-ray. Patient has not had a PPD done. She has taken aleve. She has not treated the cough due to not wanting to prolong the problem.  She admits that she ate an apple bar. She developed hives and her lips started to swell.   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 Diagnoses of Nasal congestion, Dyspnea on exertion, and Allergic reaction, initial encounter were also pertinent to this visit.  Visit Diagnosis: 1. Cough   2. Nasal congestion   3. Dyspnea on  exertion   4. Allergic reaction, initial encounter     Follow Up Instructions: Appointment as scheduled   I discussed the assessment and treatment plan with the patient. The patient was provided an opportunity to ask questions and all were answered. The patient agreed with the plan and demonstrated an understanding of the instructions.   The patient was advised to call back or seek an in-person evaluation if the symptoms worsen or if the condition fails to improve as anticipated.  I provided 20 minutes of non-face-to-face time during this encounter.   Barbette Merino, NP

## 2020-03-19 NOTE — Telephone Encounter (Signed)
Crystal please advise. Thanks! 

## 2020-03-24 ENCOUNTER — Other Ambulatory Visit: Payer: Self-pay | Admitting: Nurse Practitioner

## 2020-03-24 MED ORDER — BENZONATATE 100 MG PO CAPS: 100.0000 mg | ORAL_CAPSULE | Freq: Three times a day (TID) | ORAL | 0 refills | Status: DC | PRN

## 2020-04-13 ENCOUNTER — Ambulatory Visit: Payer: 59 | Admitting: Allergy

## 2020-06-11 ENCOUNTER — Other Ambulatory Visit: Payer: Self-pay | Admitting: Nurse Practitioner

## 2020-06-11 ENCOUNTER — Ambulatory Visit (HOSPITAL_BASED_OUTPATIENT_CLINIC_OR_DEPARTMENT_OTHER): Payer: 59 | Admitting: Radiology

## 2020-06-11 ENCOUNTER — Encounter (HOSPITAL_BASED_OUTPATIENT_CLINIC_OR_DEPARTMENT_OTHER): Payer: Self-pay

## 2020-06-11 ENCOUNTER — Telehealth (INDEPENDENT_AMBULATORY_CARE_PROVIDER_SITE_OTHER): Payer: 59 | Admitting: Nurse Practitioner

## 2020-06-11 ENCOUNTER — Encounter: Payer: Self-pay | Admitting: Nurse Practitioner

## 2020-06-11 ENCOUNTER — Ambulatory Visit (HOSPITAL_BASED_OUTPATIENT_CLINIC_OR_DEPARTMENT_OTHER)
Admission: RE | Admit: 2020-06-11 | Discharge: 2020-06-11 | Disposition: A | Discharge status: Home / Self Care | Payer: 59 | Source: Ambulatory Visit | Attending: Nurse Practitioner | Admitting: Nurse Practitioner

## 2020-06-11 ENCOUNTER — Other Ambulatory Visit: Payer: Self-pay

## 2020-06-11 VITALS — Wt 272.0 lb

## 2020-06-11 DIAGNOSIS — R062 Wheezing: Secondary | ICD-10-CM

## 2020-06-11 DIAGNOSIS — R059 Cough, unspecified: Secondary | ICD-10-CM

## 2020-06-11 DIAGNOSIS — Z8616 Personal history of COVID-19: Secondary | ICD-10-CM | POA: Insufficient documentation

## 2020-06-11 DIAGNOSIS — R0600 Dyspnea, unspecified: Secondary | ICD-10-CM | POA: Diagnosis not present

## 2020-06-11 DIAGNOSIS — R0609 Other forms of dyspnea: Secondary | ICD-10-CM

## 2020-06-11 MED ORDER — ALBUTEROL SULFATE HFA 108 (90 BASE) MCG/ACT IN AERS: 2.0000 | INHALATION_SPRAY | Freq: Four times a day (QID) | RESPIRATORY_TRACT | 2 refills | Status: DC | PRN

## 2020-06-11 NOTE — Patient Instructions (Signed)

## 2020-06-11 NOTE — Progress Notes (Signed)
   [facility] Patient [facility] [address], [place]  [address] Phone:  [phone]   Fax:  [phone]  Virtual Visit via Video Note  I connected with Erica Ferguson on 06/11/20 at  3:20 PM EDT by video and verified that I am speaking with the correct person using two identifiers.   I discussed the limitations, risks, security and privacy concerns of performing an evaluation and management service by telephone and the availability of in person appointments. I also discussed with the patient that there may be a patient responsible charge related to this service. The patient expressed understanding and agreed to proceed.  Patient in car Provider Office  History of Present Illness: Coughing over the weekend , possible sinus infection, coughing up mucus, having wheezing, no fever    Review of Systems  Constitutional: Negative for fever.  HENT: Negative for sinus pain.        No nasal drainage or sinus pressure  Respiratory: Positive for cough (mucous with talking and at night. Uses Mucinex DM) and wheezing (last night). Negative for sputum production and shortness of breath.        Chest tightness on Friday but none since She feels like her breathing has been bad since COVID in Jan.     Observations/Objective: In no acute distress, working no audible cough heard   Assessment and Plan: Assessment  Primary Diagnosis & Pertinent Problem List: The primary encounter diagnosis was Wheezing. Diagnoses of Dyspnea on exertion, Personal history of COVID-19, and Cough were also pertinent to this visit.  Visit Diagnosis: 1. Wheezing  Noted albuterol inhaler refilled  2. Dyspnea on exertion  Worsening chest x-ray pending for further evaluation  3. Personal history of COVID-19  Worsening cough and shortness of breath since Covid  4. Cough  Persistent decline benzonatate already has some at home    Follow Up Instructions:    I discussed the assessment and treatment plan  with the patient. The patient was provided an opportunity to ask questions and all were answered. The patient agreed with the plan and demonstrated an understanding of the instructions.   The patient was advised to call back or seek an in-person evaluation if the symptoms worsen or if the condition fails to improve as anticipated.  I provided 8 minutes of video- face-to-face time during this encounter.   Barbette Merino, NP

## 2020-06-15 ENCOUNTER — Other Ambulatory Visit: Payer: Self-pay | Admitting: Nurse Practitioner

## 2020-06-15 MED ORDER — BENZONATATE 100 MG PO CAPS: 100.0000 mg | ORAL_CAPSULE | Freq: Three times a day (TID) | ORAL | 0 refills | Status: AC | PRN

## 2020-07-08 ENCOUNTER — Other Ambulatory Visit: Payer: Self-pay | Admitting: Nurse Practitioner

## 2020-08-21 ENCOUNTER — Other Ambulatory Visit: Payer: Self-pay

## 2020-08-21 ENCOUNTER — Ambulatory Visit: Payer: 59 | Admitting: Physician Assistant

## 2020-08-21 ENCOUNTER — Other Ambulatory Visit (HOSPITAL_COMMUNITY)
Admission: RE | Admit: 2020-08-21 | Discharge: 2020-08-21 | Disposition: A | Discharge status: Home / Self Care | Payer: 59 | Source: Ambulatory Visit | Attending: Physician Assistant | Admitting: Physician Assistant

## 2020-08-21 VITALS — BP 166/95 | HR 56 | Temp 98.7°F | Resp 18 | Ht 62.0 in | Wt 270.0 lb

## 2020-08-21 DIAGNOSIS — R030 Elevated blood-pressure reading, without diagnosis of hypertension: Secondary | ICD-10-CM | POA: Diagnosis not present

## 2020-08-21 DIAGNOSIS — E538 Deficiency of other specified B group vitamins: Secondary | ICD-10-CM

## 2020-08-21 DIAGNOSIS — N950 Postmenopausal bleeding: Secondary | ICD-10-CM | POA: Insufficient documentation

## 2020-08-21 DIAGNOSIS — N3001 Acute cystitis with hematuria: Secondary | ICD-10-CM | POA: Diagnosis not present

## 2020-08-21 DIAGNOSIS — Z13220 Encounter for screening for lipoid disorders: Secondary | ICD-10-CM

## 2020-08-21 DIAGNOSIS — R7303 Prediabetes: Secondary | ICD-10-CM | POA: Diagnosis not present

## 2020-08-21 DIAGNOSIS — Z1159 Encounter for screening for other viral diseases: Secondary | ICD-10-CM

## 2020-08-21 LAB — POCT URINALYSIS DIP (CLINITEK)
Glucose, UA: NEGATIVE mg/dL
Leukocytes, UA: NEGATIVE
Nitrite, UA: POSITIVE — AB
POC PROTEIN,UA: >=300 — AB
Spec Grav, UA: >=1.03 — AB (ref 1.010–1.025)
Urobilinogen, UA: 1 E.U./dL
pH, UA: 5.5 (ref 5.0–8.0)

## 2020-08-21 MED ORDER — METFORMIN HCL 500 MG PO TABS
500.0000 mg | ORAL_TABLET | Freq: Every day | ORAL | 1 refills | Status: DC
  Filled 2020-08-21: qty 30, 30d supply, fill #0

## 2020-08-21 MED ORDER — NITROFURANTOIN MONOHYD MACRO 100 MG PO CAPS
100.0000 mg | ORAL_CAPSULE | Freq: Two times a day (BID) | ORAL | 0 refills | Status: AC
  Filled 2020-08-21: qty 20, 10d supply, fill #0

## 2020-08-21 NOTE — Progress Notes (Signed)
Established Patient Office Visit  Subjective:  Patient ID: Erica Ferguson, female    DOB: 1965/05/14  Age: 55 y.o. MRN: [id number]  CC: No chief complaint on file.   HPI Erica Ferguson presents for reports that she has been having some dark red vaginal bleeding which started approximately 2 days ago.  Reports that she has not had a menses in 7 years.  Denies dysuria, does endorse some low bilateral back pain, but states that is not new that is more chronic.  Does endorse that she has been having some vaginal pruritus for the past couple of months but had assumed that was from increasing showering, changing soaps or irritation from working out.  Reports last Pap within 3 years, states it was within normal limits.  Does not check blood pressure at home.  Denies any hypertensive symptoms.  States that she feels she gets anxious when her blood pressure is checked.  States that she has been diagnosed with prediabetes "for a while now".  Reports that she has been working on lifestyle modifications, states that she has only been eating fruits and vegetables for the last 19 days.  Reports that she has been trying to exercise more as well.  Reports that she drinks approximately 2-3 bottles of water a day.   Past Medical History:  Diagnosis Date   Allergy    seasonal allergies    Past Surgical History:  Procedure Laterality Date   BREAST REDUCTION SURGERY  1989   REDUCTION MAMMAPLASTY     WISDOM TOOTH EXTRACTION Bilateral 1993    Family History  Problem Relation Age of Onset   Kidney failure Mother    Hypertension Mother    Colon polyps Neg Hx    Colon cancer Neg Hx    Esophageal cancer Neg Hx    Rectal cancer Neg Hx    Stomach cancer Neg Hx     Social History   Socioeconomic History   Marital status: Single    Spouse name: Not on file   Number of children: Not on file   Years of education: Not on file   Highest education level: Not on file  Occupational History   Not on  file  Tobacco Use   Smoking status: Never   Smokeless tobacco: Never  Vaping Use   Vaping Use: Never used  Substance and Sexual Activity   Alcohol use: Not Currently   Drug use: Never   Sexual activity: Not Currently  Other Topics Concern   Not on file  Social History Narrative   Not on file   Social Determinants of Health   Financial Resource Strain: Not on file  Food Insecurity: Not on file  Transportation Needs: Not on file  Physical Activity: Not on file  Stress: Not on file  Social Connections: Not on file  Intimate Partner Violence: Not on file    Outpatient Medications Prior to Visit  Medication Sig Dispense Refill   albuterol (VENTOLIN HFA) 108 (90 Base) MCG/ACT inhaler Inhale 2 puffs into the lungs every 6 (six) hours as needed for wheezing or shortness of breath. 8 g 2   loratadine (CLARITIN) 10 MG tablet Take 10 mg by mouth daily.      Multiple Vitamin (MULTIVITAMIN) LIQD Take 5 mLs by mouth daily.     Naproxen Sodium (ALEVE PO) Take by mouth as needed.     triamcinolone cream (KENALOG) 0.1 % Apply 1 application topically 2 (two) times daily for 15 days. 30 g  2   vitamin C (ASCORBIC ACID) 250 MG tablet Take 250 mg by mouth daily.     No facility-administered medications prior to visit.    No Known Allergies  ROS Review of Systems  Constitutional:  Negative for chills and fever.  HENT: Negative.    Eyes: Negative.   Respiratory:  Negative for shortness of breath.   Gastrointestinal:  Negative for abdominal pain, nausea and vomiting.  Endocrine: Negative.   Genitourinary:  Positive for hematuria. Negative for dysuria, flank pain, frequency and vaginal discharge.  Musculoskeletal:  Positive for back pain.  Skin: Negative.   Allergic/Immunologic: Negative.   Neurological: Negative.   Hematological: Negative.   Psychiatric/Behavioral: Negative.       Objective:    Physical Exam Vitals and nursing note reviewed.  Constitutional:      Appearance:  Normal appearance. She is obese.  HENT:     Head: Normocephalic and atraumatic.     Right Ear: External ear normal.     Left Ear: External ear normal.     Nose: Nose normal.     Mouth/Throat:     Mouth: Mucous membranes are moist.     Pharynx: Oropharynx is clear.  Eyes:     Extraocular Movements: Extraocular movements intact.     Conjunctiva/sclera: Conjunctivae normal.     Pupils: Pupils are equal, round, and reactive to light.  Cardiovascular:     Rate and Rhythm: Normal rate and regular rhythm.     Heart sounds: Normal heart sounds.  Pulmonary:     Effort: Pulmonary effort is normal.     Breath sounds: Normal breath sounds.  Abdominal:     General: Abdomen is flat.     Palpations: Abdomen is soft.     Tenderness: There is no abdominal tenderness. There is no right CVA tenderness or left CVA tenderness.  Musculoskeletal:        General: Normal range of motion.     Cervical back: Normal range of motion and neck supple.  Skin:    General: Skin is warm and dry.  Neurological:     General: No focal deficit present.     Mental Status: She is alert and oriented to person, place, and time.  Psychiatric:        Mood and Affect: Mood normal.        Behavior: Behavior normal.        Thought Content: Thought content normal.        Judgment: Judgment normal.    BP (!) 166/95 (BP Location: Left Arm, Patient Position: Sitting, Cuff Size: Large)   Pulse (!) 56   Temp 98.7 F (37.1 C) (Oral)   Resp 18   Ht 5\' 2"  (1.575 m)   Wt 270 lb (122.5 kg)   SpO2 100%   BMI 49.38 kg/m  Wt Readings from Last 3 Encounters:  08/21/20 270 lb (122.5 kg)  06/11/20 272 lb (123.4 kg)  11/21/19 272 lb (123.4 kg)     Health Maintenance Due  Topic Date Due   HIV Screening  Never done   Hepatitis C Screening  Never done   PAP SMEAR-Modifier  Never done   Zoster Vaccines- Shingrix (1 of 2) Never done   COVID-19 Vaccine (2 - Booster for Janssen series) 01/12/2020    There are no preventive  care reminders to display for this patient.  Lab Results  Component Value Date   TSH 0.779 08/03/2019   Lab Results  Component Value Date  WBC 6.9 08/03/2019   HGB 14.5 08/03/2019   HCT 42.5 08/03/2019   MCV 89 08/03/2019   PLT 299 08/03/2019   Lab Results  Component Value Date   NA 138 08/03/2019   K 4.2 08/03/2019   GLUCOSE 92 08/03/2019   BUN 9 08/03/2019   CREATININE 0.89 08/03/2019   BILITOT 0.5 08/03/2019   ALKPHOS 63 08/03/2019   AST 13 08/03/2019   PROT 7.5 08/03/2019   ALBUMIN 4.0 08/03/2019   CALCIUM 9.2 08/03/2019   Lab Results  Component Value Date   CHOL 185 08/03/2019   Lab Results  Component Value Date   HDL 46 08/03/2019   Lab Results  Component Value Date   LDLCALC 122 (H) 08/03/2019   Lab Results  Component Value Date   TRIG 94 08/03/2019   Lab Results  Component Value Date   CHOLHDL 4.0 08/03/2019   Lab Results  Component Value Date   HGBA1C 6.1 (A) 08/03/2019      Assessment & Plan:   Problem List Items Addressed This Visit       Genitourinary   Acute cystitis with hematuria - Primary   Relevant Medications   nitrofurantoin, macrocrystal-monohydrate, (MACROBID) 100 MG capsule   Other Relevant Orders   Cervicovaginal ancillary only   POCT URINALYSIS DIP (CLINITEK) (Completed)   Urine Culture     Other   Prediabetes   Relevant Medications   metFORMIN (GLUCOPHAGE) 500 MG tablet   Other Relevant Orders   CBC with Differential/Platelet   Comp. Metabolic Panel (12)   Elevated blood pressure reading in office without diagnosis of hypertension   B12 deficiency   Relevant Orders   Vitamin B12   Other Visit Diagnoses     Screening, lipid       Relevant Orders   Lipid panel   Encounter for HCV screening test for low risk patient       Relevant Orders   HCV Ab w Reflex to Quant PCR      1. Acute cystitis with hematuria UA positive for urinary tract infection.  Trial Macrobid.  Patient encouraged to increase  hydration, get plenty of rest.  Red flags given for prompt reevaluation. - Cervicovaginal ancillary only - POCT URINALYSIS DIP (CLINITEK) - nitrofurantoin, macrocrystal-monohydrate, (MACROBID) 100 MG capsule; Take 1 capsule (100 mg total) by mouth 2 (two) times daily for 10 days.  Dispense: 20 capsule; Refill: 0 - Urine Culture  2. Prediabetes A1c 6.2.  Patient agreeable to trial metformin.  Patient education given on diabetic diet  Patient return to mobile unit for fasting labs  - CBC with Differential/Platelet; Future - Comp. Metabolic Panel (12); Future - metFORMIN (GLUCOPHAGE) 500 MG tablet; Take 1 tablet (500 mg total) by mouth daily with breakfast.  Dispense: 30 tablet; Refill: 1  3. Elevated blood pressure reading in office without diagnosis of hypertension Patient encouraged to check blood pressure at home, keep a written log and have available for all office visits.  Patient encouraged to return to mobile unit if she continues to have  4. B12 deficiency  - Vitamin B12; Future  5. Screening, lipid  - Lipid panel; Future  6. Encounter for HCV screening test for low risk patient  - HCV Ab w Reflex to Quant PCR; Future   I have reviewed the patient's medical history (PMH, PSH, Social History, Family History, Medications, and allergies) , and have been updated if relevant. I spent 32 minutes reviewing chart and  face to face time with  patient.    Meds ordered this encounter  Medications   nitrofurantoin, macrocrystal-monohydrate, (MACROBID) 100 MG capsule    Sig: Take 1 capsule (100 mg total) by mouth 2 (two) times daily for 10 days.    Dispense:  20 capsule    Refill:  0    Order Specific Question:   Supervising Provider    Answer:   Storm Frisk [[id number]]   metFORMIN (GLUCOPHAGE) 500 MG tablet    Sig: Take 1 tablet (500 mg total) by mouth daily with breakfast.    Dispense:  30 tablet    Refill:  1    Order Specific Question:   Supervising Provider    Answer:    Storm Frisk [[id number]]    Follow-up: Return in about 1 day (around 08/22/2020) for Fasting  labs.    Kasandra Knudsen Mayers, PA-C

## 2020-08-21 NOTE — Progress Notes (Signed)
Patient presents with breakthrough bleeding after being post menopausal for 7 years. Patient reports bleeding beginning Sunday starting as dark red and yesterday was a lighter red. Patient denies pain at the time. Patient reports some itchiness. Patient is not currently sexually active Patient has not taken medication today and patient has eaten today. Request refill on Cream for skin and inhaler

## 2020-08-21 NOTE — Patient Instructions (Addendum)
Please return to the mobile unit for fasting labs.  As soon as your results are available we will call you to discuss.  You will take Macrobid twice a day Your A1c is 6.2.  I encourage you to follow a low sugar diet.  Your blood pressure is elevated today, I encourage you to check your blood pressure at home on a daily basis, keep a written log and have available for all office visits.  Please feel free to return to the mobile unit if you continue to have elevated readings.  Please let us know if is anything else we can do for you.  Roney Jaffe, PA-C Physician Assistant [facility] [email or web address]   How to Take Your Blood Pressure Blood pressure is a measurement of how strongly your blood is pressing against the walls of your arteries. Arteries are blood vessels that carry blood from your heart throughout your body. Your health care provider takes your blood pressure at each office visit. You can also take your own blood pressure athome with a blood pressure monitor. You may need to take your own blood pressure to: Confirm a diagnosis of high blood pressure (hypertension). Monitor your blood pressure over time. Make sure your blood pressure medicine is working. Supplies needed: Blood pressure monitor. Dining room chair to sit in. Table or desk. Small notebook and pencil or pen. How to prepare To get the most accurate reading, avoid the following for 30 minutes before you check your blood pressure: Drinking caffeine. Drinking alcohol. Eating. Smoking. Exercising. Five minutes before you check your blood pressure: Use the bathroom and urinate so that you have an empty bladder. Sit quietly in a dining room chair. Do not sit in a soft couch or an armchair. Do not talk. How to take your blood pressure To check your blood pressure, follow the instructions in the manual that came with your blood pressure monitor. If you have  a digital blood pressure monitor, the instructions may be as follows: Sit up straight in a chair. Place your feet on the floor. Do not cross your ankles or legs. Rest your left arm at the level of your heart on a table or desk or on the arm of a chair. Pull up your shirt sleeve. Wrap the blood pressure cuff around the upper part of your left arm, 1 inch (2.5 cm) above your elbow. It is best to wrap the cuff around bare skin. Fit the cuff snugly around your arm. You should be able to place only one finger between the cuff and your arm. Position the cord so that it rests in the bend of your elbow. Press the power button. Sit quietly while the cuff inflates and deflates. Read the digital reading on the monitor screen and write the numbers down (record them) in a notebook. Wait 2-3 minutes, then repeat the steps, starting at step 1. What does my blood pressure reading mean? A blood pressure reading consists of a higher number over a lower number. Ideally, your blood pressure should be below 120/80. The first ("top") number is called the systolic pressure. It is a measure of the pressure in your arteries as your heart beats. The second ("bottom") number is called the diastolic pressure. It is a measure of the pressure in your arteries as theheart relaxes. Blood pressure is classified into five stages. The following are the stages for adults who do not have a short-term serious illness or a chronic condition. Systolic pressure and diastolic pressure  are measured in a unit called mm Hg (millimeters of mercury). Normal Systolic pressure: below 120. Diastolic pressure: below 80. Elevated Systolic pressure: 120-129. Diastolic pressure: below 80. Hypertension stage 1 Systolic pressure: 130-139. Diastolic pressure: 80-89. Hypertension stage 2 Systolic pressure: 140 or above. Diastolic pressure: 90 or above. You can have elevated blood pressure or hypertension even if only the systolicor only the  diastolic number in your reading is higher than normal. Follow these instructions at home: Medicines Take over-the-counter and prescription medicines only as told by your health care provider. Tell your health care provider if you are having any side effects from blood pressure medicine. General instructions Check your blood pressure as often as recommended by your health care provider. Check your blood pressure at the same time every day. Take your monitor to the next appointment with your health care provider to make sure that: You are using it correctly. It provides accurate readings. Understand what your goal blood pressure numbers are. Keep all follow-up visits as told by your health care provider. This is important. General tips Your health care provider can suggest a reliable monitor that will meet your needs. There are several types of home blood pressure monitors. Choose a monitor that has an arm cuff. Do not choose a monitor that measures your blood pressure from your wrist or finger. Choose a cuff that wraps snugly around your upper arm. You should be able to fit only one finger between your arm and the cuff. You can buy a blood pressure monitor at most drugstores or online. Where to find more information American Heart Association: www.heart.org Contact a health care provider if: Your blood pressure is consistently high. Your blood pressure is suddenly low. Get help right away if: Your systolic blood pressure is higher than 180. Your diastolic blood pressure is higher than 120. Summary Blood pressure is a measurement of how strongly your blood is pressing against the walls of your arteries. A blood pressure reading consists of a higher number over a lower number. Ideally, your blood pressure should be below 120/80. Check your blood pressure at the same time every day. Avoid caffeine, alcohol, smoking, and exercise for 30 minutes prior to checking your blood pressure. These  agents can affect the accuracy of the blood pressure reading. This information is not intended to replace advice given to you by your health care provider. Make sure you discuss any questions you have with your healthcare provider. Document Revised: 01/04/2020 Document Reviewed: 02/18/2019 Elsevier Patient Education  2022 [employer].

## 2020-08-22 ENCOUNTER — Other Ambulatory Visit (HOSPITAL_COMMUNITY): Payer: Self-pay

## 2020-08-22 DIAGNOSIS — R030 Elevated blood-pressure reading, without diagnosis of hypertension: Secondary | ICD-10-CM | POA: Insufficient documentation

## 2020-08-22 DIAGNOSIS — R7303 Prediabetes: Secondary | ICD-10-CM | POA: Insufficient documentation

## 2020-08-22 DIAGNOSIS — E538 Deficiency of other specified B group vitamins: Secondary | ICD-10-CM | POA: Insufficient documentation

## 2020-08-22 DIAGNOSIS — N3001 Acute cystitis with hematuria: Secondary | ICD-10-CM | POA: Insufficient documentation

## 2020-08-23 ENCOUNTER — Other Ambulatory Visit: Payer: Self-pay

## 2020-08-23 LAB — CERVICOVAGINAL ANCILLARY ONLY
Bacterial Vaginitis (gardnerella): NEGATIVE
Candida Glabrata: NEGATIVE
Candida Vaginitis: NEGATIVE
Chlamydia: NEGATIVE
Comment: NEGATIVE
Comment: NEGATIVE
Comment: NEGATIVE
Comment: NEGATIVE
Comment: NEGATIVE
Comment: NORMAL
Neisseria Gonorrhea: NEGATIVE
Trichomonas: NEGATIVE

## 2020-08-24 ENCOUNTER — Other Ambulatory Visit (HOSPITAL_COMMUNITY): Payer: Self-pay

## 2020-08-24 ENCOUNTER — Encounter: Payer: Self-pay | Admitting: Physician Assistant

## 2020-08-24 LAB — URINE CULTURE

## 2020-08-24 MED ORDER — TRIAMCINOLONE ACETONIDE 0.1 % EX CREA
1.0000 "application " | TOPICAL_CREAM | Freq: Two times a day (BID) | CUTANEOUS | 2 refills | Status: DC
  Filled 2020-08-24: qty 30, 15d supply, fill #0

## 2020-08-24 NOTE — Telephone Encounter (Signed)
Left voicemail advising patient to come in for nurse visit.

## 2020-10-24 ENCOUNTER — Other Ambulatory Visit: Payer: Self-pay

## 2020-10-24 ENCOUNTER — Encounter (HOSPITAL_COMMUNITY): Payer: Self-pay | Admitting: Emergency Medicine

## 2020-10-24 ENCOUNTER — Other Ambulatory Visit (HOSPITAL_BASED_OUTPATIENT_CLINIC_OR_DEPARTMENT_OTHER): Payer: Self-pay

## 2020-10-24 ENCOUNTER — Ambulatory Visit (HOSPITAL_COMMUNITY)
Admission: EM | Admit: 2020-10-24 | Discharge: 2020-10-24 | Disposition: A | Discharge status: Home / Self Care | Payer: 59 | Attending: Medical Oncology | Admitting: Medical Oncology

## 2020-10-24 DIAGNOSIS — H8111 Benign paroxysmal vertigo, right ear: Secondary | ICD-10-CM

## 2020-10-24 MED ORDER — MECLIZINE HCL 25 MG PO TABS
25.0000 mg | ORAL_TABLET | Freq: Three times a day (TID) | ORAL | 0 refills | Status: DC | PRN
  Filled 2020-10-24: qty 30, 10d supply, fill #0

## 2020-10-24 MED ORDER — FLUTICASONE PROPIONATE 50 MCG/ACT NA SUSP
2.0000 | Freq: Every day | NASAL | 0 refills | Status: DC
  Filled 2020-10-24: qty 16, 30d supply, fill #0

## 2020-10-24 NOTE — ED Triage Notes (Signed)
Onset Saturday night of feeling unwell.  Noticed when putting her head back, room started spinning.  Same has continued, but getting worse.  Taking longer for dizziness to resolve.  Denies pain.  Patient denies any history of the same.  Dizziness is triggered by movement of head

## 2020-10-24 NOTE — ED Provider Notes (Signed)
[facility]    CSN: [id number] Arrival date & time: 10/24/20  2919      History   Chief Complaint Chief Complaint  Patient presents with   Dizziness    HPI Erica Ferguson is a 55 y.o. female.   HPI  Dizziness: Pt reports that since Saturday she has had a few episodes of dizziness. Dizziness described as the room spinning. Occurs with head positional movements. No tinnitus, head injury, fever, loss of hearing or vision changes. She has tried nothing yet for symptoms.    Past Medical History:  Diagnosis Date   Allergy    seasonal allergies    Patient Active Problem List   Diagnosis Date Noted   Acute cystitis with hematuria 08/22/2020   Prediabetes 08/22/2020   Elevated blood pressure reading in office without diagnosis of hypertension 08/22/2020   B12 deficiency 08/22/2020    Past Surgical History:  Procedure Laterality Date   BREAST REDUCTION SURGERY  1989   REDUCTION MAMMAPLASTY     WISDOM TOOTH EXTRACTION Bilateral 1993    OB History     Gravida  0   Para  0   Term  0   Preterm  0   AB  0   Living  0      SAB  0   IAB  0   Ectopic  0   Multiple  0   Live Births  0            Home Medications    Prior to Admission medications   Medication Sig Start Date End Date Taking? Authorizing Provider  albuterol (VENTOLIN HFA) 108 (90 Base) MCG/ACT inhaler Inhale 2 puffs into the lungs every 6 (six) hours as needed for wheezing or shortness of breath. Patient not taking: Reported on 10/24/2020 06/11/20   Barbette Merino, NP  loratadine (CLARITIN) 10 MG tablet Take 10 mg by mouth daily.     [provider]  metFORMIN (GLUCOPHAGE) 500 MG tablet Take 1 tablet (500 mg total) by mouth daily with breakfast. Patient not taking: Reported on 10/24/2020 08/21/20   Mayers, Cari S, PA-C  Multiple Vitamin (MULTIVITAMIN) LIQD Take 5 mLs by mouth daily. Patient not taking: Reported on 10/24/2020    [provider]  Naproxen Sodium  (ALEVE PO) Take by mouth as needed. Patient not taking: Reported on 10/24/2020    [provider]  triamcinolone cream (KENALOG) 0.1 % Apply 1 application topically 2 (two) times daily for 15 days. 08/24/20 09/11/20  Barbette Merino, NP  vitamin C (ASCORBIC ACID) 250 MG tablet Take 250 mg by mouth daily. Patient not taking: Reported on 10/24/2020    [provider]    Family History Family History  Problem Relation Age of Onset   Kidney failure Mother    Hypertension Mother    Colon polyps Neg Hx    Colon cancer Neg Hx    Esophageal cancer Neg Hx    Rectal cancer Neg Hx    Stomach cancer Neg Hx     Social History Social History   Tobacco Use   Smoking status: Never   Smokeless tobacco: Never  Vaping Use   Vaping Use: Never used  Substance Use Topics   Alcohol use: Not Currently   Drug use: Never     Allergies   Patient has no known allergies.   Review of Systems Review of Systems  As stated above in HPI Physical Exam Triage Vital Signs ED Triage  Vitals  Enc Vitals Group     BP 10/24/20 0913 (!) 155/84     Pulse Rate 10/24/20 0913 60     Resp 10/24/20 0913 20     Temp 10/24/20 0913 98.4 F (36.9 C)     Temp Source 10/24/20 0913 Oral     SpO2 10/24/20 0913 100 %     Weight --      Height --      Head Circumference --      Peak Flow --      Pain Score 10/24/20 0909 0     Pain Loc --      Pain Edu? --      Excl. in GC? --    No data found.  Updated Vital Signs BP (!) 155/84 (BP Location: Right Arm) Comment (BP Location): large cuff  Pulse 60   Temp 98.4 F (36.9 C) (Oral)   Resp 20   SpO2 100%   Physical Exam Vitals and nursing note reviewed.  Constitutional:      General: She is not in acute distress.    Appearance: Normal appearance. She is not ill-appearing, toxic-appearing or diaphoretic.  HENT:     Head: Normocephalic and atraumatic.  Eyes:     Extraocular Movements: Extraocular movements intact.     Pupils: Pupils are  equal, round, and reactive to light.     Comments: Mild right sided nystagmus  Neck:     Vascular: No carotid bruit.  Cardiovascular:     Rate and Rhythm: Normal rate and regular rhythm.     Pulses: Normal pulses.     Heart sounds: Normal heart sounds.  Musculoskeletal:     Cervical back: Normal range of motion and neck supple.  Skin:    Coloration: Skin is not jaundiced or pale.  Neurological:     General: No focal deficit present.     Mental Status: She is alert and oriented to person, place, and time.     Cranial Nerves: No cranial nerve deficit.     Motor: No weakness.     Gait: Gait normal.     Deep Tendon Reflexes: Reflexes normal.     UC Treatments / Results  Labs (all labs ordered are listed, but only abnormal results are displayed) Labs Reviewed - No data to display  EKG   Radiology No results found.  Procedures Procedures (including critical care time)  Medications Ordered in UC Medications - No data to display  Initial Impression / Assessment and Plan / UC Course  I have reviewed the triage vital signs and the nursing notes.  Pertinent labs & imaging results that were available during my care of the patient were reviewed by me and considered in my medical decision making (see chart for details).     New. Treating for BPPV. Discussed with patient including how this occurs, treatment options, red flag signs and symptoms. Medications sent in and discussed how to take along with common potential side effects and precautions. Follow up PRN.  Final Clinical Impressions(s) / UC Diagnoses   Final diagnoses:  None   Discharge Instructions   None    ED Prescriptions   None    PDMP not reviewed this encounter.   Rushie Chestnut, [place] 10/24/20 [phone]

## 2020-10-29 ENCOUNTER — Ambulatory Visit: Payer: 59 | Admitting: Nurse Practitioner

## 2020-10-29 ENCOUNTER — Other Ambulatory Visit (HOSPITAL_BASED_OUTPATIENT_CLINIC_OR_DEPARTMENT_OTHER): Payer: Self-pay

## 2020-10-29 ENCOUNTER — Other Ambulatory Visit: Payer: Self-pay

## 2020-10-29 VITALS — BP 164/92 | HR 58 | Temp 97.4°F | Ht 62.0 in | Wt 258.0 lb

## 2020-10-29 DIAGNOSIS — R7303 Prediabetes: Secondary | ICD-10-CM | POA: Diagnosis not present

## 2020-10-29 DIAGNOSIS — H8110 Benign paroxysmal vertigo, unspecified ear: Secondary | ICD-10-CM | POA: Diagnosis not present

## 2020-10-29 DIAGNOSIS — R0600 Dyspnea, unspecified: Secondary | ICD-10-CM

## 2020-10-29 DIAGNOSIS — R030 Elevated blood-pressure reading, without diagnosis of hypertension: Secondary | ICD-10-CM | POA: Diagnosis not present

## 2020-10-29 DIAGNOSIS — R0609 Other forms of dyspnea: Secondary | ICD-10-CM

## 2020-10-29 DIAGNOSIS — R062 Wheezing: Secondary | ICD-10-CM | POA: Diagnosis not present

## 2020-10-29 DIAGNOSIS — Z13220 Encounter for screening for lipoid disorders: Secondary | ICD-10-CM

## 2020-10-29 DIAGNOSIS — R001 Bradycardia, unspecified: Secondary | ICD-10-CM | POA: Diagnosis not present

## 2020-10-29 LAB — POCT URINALYSIS DIPSTICK
Bilirubin, UA: NEGATIVE
Blood, UA: NEGATIVE
Glucose, UA: NEGATIVE
Ketones, UA: NEGATIVE
Leukocytes, UA: NEGATIVE
Nitrite, UA: NEGATIVE
Protein, UA: NEGATIVE
Spec Grav, UA: 1.025 (ref 1.010–1.025)
Urobilinogen, UA: 0.2 E.U./dL
pH, UA: 7 (ref 5.0–8.0)

## 2020-10-29 LAB — POCT GLYCOSYLATED HEMOGLOBIN (HGB A1C): Hemoglobin A1C: 6 % — AB (ref 4.0–5.6)

## 2020-10-29 MED ORDER — ALBUTEROL SULFATE HFA 108 (90 BASE) MCG/ACT IN AERS
2.0000 | INHALATION_SPRAY | Freq: Four times a day (QID) | RESPIRATORY_TRACT | 2 refills | Status: DC | PRN
  Filled 2020-10-29: qty 8.5, 25d supply, fill #0

## 2020-10-29 NOTE — Patient Instructions (Signed)
Benign Positional Vertigo ?Vertigo is the feeling that you or your surroundings are moving when they are not. Benign positional vertigo is the most common form of vertigo. This is usually a harmless condition (benign). This condition is positional. This means that symptoms are triggered by certain movements and positions. ?This condition can be dangerous if it occurs while you are doing something that could cause harm to yourself or others. This includes activities such as driving or operating machinery. ?What are the causes? ?The inner ear has fluid-filled canals that help your brain sense movement and balance. When the fluid moves, the brain receives messages about your body's position. ?With benign positional vertigo, calcium crystals in the inner ear break free and disturb the inner ear area. This causes your brain to receive confusing messages about your body's position. ?What increases the risk? ?You are more likely to develop this condition if: ?You are a woman. ?You are 50 years of age or older. ?You have recently had a head injury. ?You have an inner ear disease. ?What are the signs or symptoms? ?Symptoms of this condition usually happen when you move your head or your eyes in different directions. Symptoms may start suddenly and usually last for less than a minute. They include: ?Loss of balance and falling. ?Feeling like you are spinning or moving. ?Feeling like your surroundings are spinning or moving. ?Nausea and vomiting. ?Blurred vision. ?Dizziness. ?Involuntary eye movement (nystagmus). ?Symptoms can be mild and cause only minor problems, or they can be severe and interfere with daily life. Episodes of benign positional vertigo may return (recur) over time. Symptoms may also improve over time. ?How is this diagnosed? ?This condition may be diagnosed based on: ?Your medical history. ?A physical exam of the head, neck, and ears. ?Positional tests to check for or stimulate vertigo. You may be asked to  turn your head and change positions, such as going from sitting to lying down. A health care provider will watch for symptoms of vertigo. ?You may be referred to a health care provider who specializes in ear, nose, and throat problems (ENT or otolaryngologist) or a provider who specializes in disorders of the nervous system (neurologist). ?How is this treated? ?This condition may be treated in a session in which your health care provider moves your head in specific positions to help the displaced crystals in your inner ear move. Treatment for this condition may take several sessions. Surgery may be needed in severe cases, but this is rare. ?In some cases, benign positional vertigo may resolve on its own in 2-4 weeks. ?Follow these instructions at home: ?Safety ?Move slowly. Avoid sudden body or head movements or certain positions, as told by your health care provider. ?Avoid driving or operating machinery until your health care provider says it is safe. ?Avoid doing any tasks that would be dangerous to you or others if vertigo occurs. ?If you have trouble walking or keeping your balance, try using a cane for stability. If you feel dizzy or unstable, sit down right away. ?Return to your normal activities as told by your health care provider. Ask your health care provider what activities are safe for you. ?General instructions ?Take over-the-counter and prescription medicines only as told by your health care provider. ?Drink enough fluid to keep your urine pale yellow. ?Keep all follow-up visits. This is important. ?Contact a health care provider if: ?You have a fever. ?Your condition gets worse or you develop new symptoms. ?Your family or friends notice any behavioral changes. ?You   have nausea or vomiting that gets worse. ?You have numbness or a prickling and tingling sensation. ?Get help right away if you: ?Have difficulty speaking or moving. ?Are always dizzy or faint. ?Develop severe headaches. ?Have weakness in  your legs or arms. ?Have changes in your hearing or vision. ?Develop a stiff neck. ?Develop sensitivity to light. ?These symptoms may represent a serious problem that is an emergency. Do not wait to see if the symptoms will go away. Get medical help right away. Call your local emergency services (911 in the [place] Do not drive yourself to the hospital. ?Summary ?Vertigo is the feeling that you or your surroundings are moving when they are not. Benign positional vertigo is the most common form of vertigo. ?This condition is caused by calcium crystals in the inner ear that become displaced. This causes a disturbance in an area of the inner ear that helps your brain sense movement and balance. ?Symptoms include loss of balance and falling, feeling that you or your surroundings are moving, nausea and vomiting, and blurred vision. ?This condition can be diagnosed based on symptoms, a physical exam, and positional tests. ?Follow safety instructions as told by your health care provider and keep all follow-up visits. This is important. ?This information is not intended to replace advice given to you by your health care provider. Make sure you discuss any questions you have with your health care provider. ?Document Revised: 01/25/2020 Document Reviewed: 01/25/2020 ?Elsevier Patient Education ? 2022 [employer] ? ?

## 2020-10-29 NOTE — Progress Notes (Signed)
[facility] Patient [facility] [address] [place], [place]  [address] Phone:  [phone]   Fax:  [phone]   Established Patient Office Visit  Subjective:  Patient ID: Erica Ferguson, female    DOB: [birth year]  Age: 55 y.o. MRN: [id number]  CC:  Chief Complaint  Patient presents with   Follow-up    Urgent Care 8/17 Benign paroxysmal positional vertigo of right ear Clinical impression. Larey Seat and hit head on a desk about 4 months ago    HPI Erica Ferguson presents for follow up. She  has a past medical history of Allergy.   Vertigo She complains of dizziness. The dizziness has been present for a few months. She describes the symptoms as vertigo.  She reports being seen at urgent care and started on meclizine.  She reports that she not been able to take the Meclizine.  She admits that when asked that urgent care that she had fallen she did not any injury.  She reports that she had fallen and hit her head on the desk during a fall. She fell when she was trying to seat in her chair. She reports hitting the front of her head on the front of the desk. She denies any injury. However the dizziness just started this past Saturday August 17.  She denies any changes that occurred. She reports that lying back she felt the dizziness. She reports she is fasting. She has been fasting for 90 days. She has been eating fruits and veggies with rice being recently added. She does have nausea with the meclizine.  The Flonase was draining down into the back of her throat.  She thought this was effective initially.  She does not feel like her BP is not is evaluated.  She reports that her heart rate has been in the low of a past visit to the mobile unit. Denies visual changes, shortness of breath, dyspnea on exertion, chest pain, nausea, vomiting or any edema.    Past Medical History:  Diagnosis Date   Allergy    seasonal allergies    Past Surgical History:  Procedure Laterality Date   BREAST  REDUCTION SURGERY  1989   REDUCTION MAMMAPLASTY     WISDOM TOOTH EXTRACTION Bilateral 1993    Family History  Problem Relation Age of Onset   Kidney failure Mother    Hypertension Mother    Colon polyps Neg Hx    Colon cancer Neg Hx    Esophageal cancer Neg Hx    Rectal cancer Neg Hx    Stomach cancer Neg Hx     Social History   Socioeconomic History   Marital status: Single    Spouse name: Not on file   Number of children: Not on file   Years of education: Not on file   Highest education level: Not on file  Occupational History   Not on file  Tobacco Use   Smoking status: Never   Smokeless tobacco: Never  Vaping Use   Vaping Use: Never used  Substance and Sexual Activity   Alcohol use: Not Currently   Drug use: Never   Sexual activity: Not Currently  Other Topics Concern   Not on file  Social History Narrative   Not on file   Social Determinants of Health   Financial Resource Strain: Not on file  Food Insecurity: Not on file  Transportation Needs: Not on file  Physical Activity: Not on file  Stress: Not on file  Social Connections: Not on file  Intimate Partner Violence: Not on file    Outpatient Medications Prior to Visit  Medication Sig Dispense Refill   fluticasone (FLONASE) 50 MCG/ACT nasal spray Place 2 sprays into both nostrils daily. 16 g 0   meclizine (ANTIVERT) 25 MG tablet Take 1 tablet (25 mg total) by mouth 3 (three) times daily as needed for dizziness. 30 tablet 0   Multiple Vitamin (MULTIVITAMIN) LIQD Take 5 mLs by mouth daily.     Naproxen Sodium (ALEVE PO) Take by mouth as needed.     vitamin C (ASCORBIC ACID) 250 MG tablet Take 250 mg by mouth daily.     albuterol (VENTOLIN HFA) 108 (90 Base) MCG/ACT inhaler Inhale 2 puffs into the lungs every 6 (six) hours as needed for wheezing or shortness of breath. 8 g 2   metFORMIN (GLUCOPHAGE) 500 MG tablet Take 1 tablet (500 mg total) by mouth daily with breakfast. (Patient not taking: No sig  reported) 30 tablet 1   triamcinolone cream (KENALOG) 0.1 % Apply 1 application topically 2 (two) times daily for 15 days. 30 g 2   loratadine (CLARITIN) 10 MG tablet Take 10 mg by mouth daily.      No facility-administered medications prior to visit.    No Known Allergies  ROS Review of Systems    Objective:    Physical Exam Constitutional:      Appearance: She is obese.  HENT:     Head: Normocephalic and atraumatic.     Nose: Nose normal.     Mouth/Throat:     Mouth: Mucous membranes are moist.  Cardiovascular:     Rate and Rhythm: Normal rate and regular rhythm.     Pulses: Normal pulses.     Heart sounds: Normal heart sounds.  Pulmonary:     Effort: Pulmonary effort is normal.     Breath sounds: Normal breath sounds.  Abdominal:     Palpations: Abdomen is soft.  Musculoskeletal:        General: Normal range of motion.     Cervical back: Normal range of motion.  Skin:    General: Skin is warm and dry.     Capillary Refill: Capillary refill takes less than 2 seconds.  Neurological:     General: No focal deficit present.     Mental Status: She is alert and oriented to person, place, and time.  Psychiatric:        Mood and Affect: Mood normal.        Behavior: Behavior normal.        Thought Content: Thought content normal.        Judgment: Judgment normal.    BP (!) 164/92 (BP Location: Right Arm, Patient Position: Sitting)   Pulse (!) 58   Temp (!) 97.4 F (36.3 C)   Ht 5\' 2"  (1.575 m)   Wt 258 lb 0.4 oz (117 kg)   SpO2 99%   BMI 47.19 kg/m  Wt Readings from Last 3 Encounters:  10/29/20 258 lb 0.4 oz (117 kg)  08/21/20 270 lb (122.5 kg)  06/11/20 272 lb (123.4 kg)     Health Maintenance Due  Topic Date Due   HIV Screening  Never done   Hepatitis C Screening  Never done   PAP SMEAR-Modifier  Never done   COVID-19 Vaccine (2 - Booster for Janssen series) 01/12/2020   INFLUENZA VACCINE  10/08/2020    There are no preventive care reminders to  display for  this patient.  Lab Results  Component Value Date   TSH 0.779 08/03/2019   Lab Results  Component Value Date   WBC 6.9 08/03/2019   HGB 14.5 08/03/2019   HCT 42.5 08/03/2019   MCV 89 08/03/2019   PLT 299 08/03/2019   Lab Results  Component Value Date   NA 138 08/03/2019   K 4.2 08/03/2019   GLUCOSE 92 08/03/2019   BUN 9 08/03/2019   CREATININE 0.89 08/03/2019   BILITOT 0.5 08/03/2019   ALKPHOS 63 08/03/2019   AST 13 08/03/2019   PROT 7.5 08/03/2019   ALBUMIN 4.0 08/03/2019   CALCIUM 9.2 08/03/2019   Lab Results  Component Value Date   CHOL 185 08/03/2019   Lab Results  Component Value Date   HDL 46 08/03/2019   Lab Results  Component Value Date   LDLCALC 122 (H) 08/03/2019   Lab Results  Component Value Date   TRIG 94 08/03/2019   Lab Results  Component Value Date   CHOLHDL 4.0 08/03/2019   Lab Results  Component Value Date   HGBA1C 6.0 (A) 10/29/2020      Assessment & Plan:   Problem List Items Addressed This Visit       Other   Prediabetes - Primary Consider home glucose monitoring Weight loss at least 5% of current body weight is can be achieved with lifestyle modification dietary changes and regular daily exercise Encourage blood pressure control goal <120/80 and maintaining total cholesterol <200 Follow-up every 3 to 6 months for reevaluation Education material provided   Relevant Orders   HgB A1c (Completed)   Urinalysis Dipstick (Completed)   Comp. Metabolic Panel (12)   Other Visit Diagnoses     Wheezing   Relevant Medications   albuterol (VENTOLIN HFA) 108 (90 Base) MCG/ACT inhaler   Screening for lipid disorders       Relevant Orders   Lipid panel   Bradycardia     Persistent EKG sinus however cannot rule out old infarct To discuss further evaluation   Relevant Orders   TSH   Vertigo, benign positional, unspecified laterality     Persistent Long discussion with patient about possible causes Education  information provided   Elevated blood pressure without diagnosis of hypertension Weight loss at least 5% of current body weight is can be achieved with lifestyle modification dietary changes and regular daily exercise Encourage blood pressure control goal <120/80 and maintaining total cholesterol <200 Follow-up every 3 to 6 months for reevaluation         Meds ordered this encounter  Medications   albuterol (VENTOLIN HFA) 108 (90 Base) MCG/ACT inhaler    Sig: Inhale 2 puffs into the lungs every 6 (six) hours as needed for wheezing or shortness of breath.    Dispense:  8.5 g    Refill:  2    Follow-up: Return in about 4 weeks (around 11/26/2020) for Follow up HTN [id number].    Barbette Merino, NP

## 2020-10-31 ENCOUNTER — Encounter: Payer: Self-pay | Admitting: Nurse Practitioner

## 2020-11-15 ENCOUNTER — Other Ambulatory Visit (HOSPITAL_BASED_OUTPATIENT_CLINIC_OR_DEPARTMENT_OTHER): Payer: Self-pay

## 2020-11-29 ENCOUNTER — Ambulatory Visit: Payer: 59 | Admitting: Nurse Practitioner

## 2020-12-08 ENCOUNTER — Other Ambulatory Visit (HOSPITAL_COMMUNITY): Payer: Self-pay

## 2021-02-26 ENCOUNTER — Ambulatory Visit
Admission: EM | Admit: 2021-02-26 | Discharge: 2021-02-26 | Disposition: A | Discharge status: Home / Self Care | Payer: 59

## 2021-02-26 ENCOUNTER — Other Ambulatory Visit: Payer: Self-pay

## 2021-02-26 ENCOUNTER — Other Ambulatory Visit (HOSPITAL_COMMUNITY): Payer: Self-pay

## 2021-02-26 DIAGNOSIS — I10 Essential (primary) hypertension: Secondary | ICD-10-CM

## 2021-02-26 DIAGNOSIS — L509 Urticaria, unspecified: Secondary | ICD-10-CM

## 2021-02-26 DIAGNOSIS — T783XXA Angioneurotic edema, initial encounter: Secondary | ICD-10-CM

## 2021-02-26 MED ORDER — FAMOTIDINE 40 MG PO TABS
40.0000 mg | ORAL_TABLET | Freq: Every day | ORAL | 2 refills | Status: DC
  Filled 2021-02-26: qty 30, 30d supply, fill #0

## 2021-02-26 MED ORDER — ADULT BLOOD PRESSURE CUFF LG KIT: PACK | 0 refills | Status: DC

## 2021-02-26 MED ORDER — METHYLPREDNISOLONE SODIUM SUCC 125 MG IJ SOLR
125.0000 mg | Freq: Once | INTRAMUSCULAR | Status: AC
  Administered 2021-02-26: 09:00:00 125 mg via INTRAMUSCULAR

## 2021-02-26 MED ORDER — CLONIDINE HCL 0.2 MG PO TABS
0.2000 mg | ORAL_TABLET | Freq: Once | ORAL | Status: AC
  Administered 2021-02-26: 10:00:00 0.2 mg via ORAL

## 2021-02-26 MED ORDER — MONTELUKAST SODIUM 10 MG PO TABS
10.0000 mg | ORAL_TABLET | Freq: Every day | ORAL | 2 refills | Status: DC
  Filled 2021-02-26: qty 30, 30d supply, fill #0

## 2021-02-26 MED ORDER — EPINEPHRINE 0.3 MG/0.3ML IJ SOAJ
INTRAMUSCULAR | 5 refills | Status: DC
  Filled 2021-02-26: qty 2, 30d supply, fill #0

## 2021-02-26 MED ORDER — CETIRIZINE HCL 10 MG PO TABS
10.0000 mg | ORAL_TABLET | Freq: Every day | ORAL | 2 refills | Status: DC
  Filled 2021-02-26: qty 30, 30d supply, fill #0

## 2021-02-26 MED ORDER — METHYLPREDNISOLONE 4 MG PO TABS
ORAL_TABLET | ORAL | 0 refills | Status: AC
  Filled 2021-02-26: qty 19, 2d supply, fill #0
  Filled 2021-02-26: qty 36, 8d supply, fill #0

## 2021-02-26 NOTE — ED Triage Notes (Addendum)
Patient reports having an allergic reaction, she has swelling to her lower lip and reports having hives. Pt states this began two weeks ago, but the swelling is intermittent. Patient does not know what caused the reaction. Air way is patent, pt denies any pain/ scratchy throat, patient is verbal.

## 2021-02-26 NOTE — ED Provider Notes (Signed)
[facility]    CSN: [id number] Arrival date & time: 02/26/21  9371    HISTORY  No chief complaint on file.  HPI Erica Ferguson is a 55 y.o. female. Patient presents today with a 2-week history of rash that she states initially was covering her entire body, states she been taking Benadryl daily for the past 4 days with some improvement.  Patient states she continues to have a rash on the lower part of her left side of her abdomen patient states that 2 days ago, she noticed that her lower lip began to swell.  Patient states it has been a little slightly larger than it is at this time, still feels very full and tender to touch.  Patient states that when she noticed her lip swelling she did not seek immediate attention.  Patient states she has been referred for allergy testing, states she has not been able to afford the co-pays that she has not had this time.  Patient currently not using any other type of allergy medication, states she is never been prescribed an EpiPen.  Patient states that when her lip began to swell she noticed that she was having a little bit difficulty breathing as well, patient states she manages by taking Benadryl and resting.  Blood pressure very elevated at arrival today.  EMR reviewed, patient is being followed for elevated blood pressure without diagnosis of hypertension, patient has been advised to lose weight and increase daily exercise, apparently patient has not done so.  Patient is not currently taking any medications for high blood pressure nor has she been prescribed any.  Patient states she does not have a blood pressure cuff.    The history is provided by the patient.  Past Medical History:  Diagnosis Date   Allergy    seasonal allergies   Patient Active Problem List   Diagnosis Date Noted   Acute cystitis with hematuria 08/22/2020   Prediabetes 08/22/2020   Elevated blood pressure reading in office without diagnosis of hypertension 08/22/2020    B12 deficiency 08/22/2020   Past Surgical History:  Procedure Laterality Date   BREAST REDUCTION SURGERY  1989   REDUCTION MAMMAPLASTY     WISDOM TOOTH EXTRACTION Bilateral 1993   OB History     Gravida  0   Para  0   Term  0   Preterm  0   AB  0   Living  0      SAB  0   IAB  0   Ectopic  0   Multiple  0   Live Births  0          Home Medications    Prior to Admission medications   Medication Sig Start Date End Date Taking? Authorizing Provider  Blood Pressure Monitoring (ADULT BLOOD PRESSURE CUFF LG) KIT Use as directed for daily blood pressure monitoring 02/26/21  Yes Erica Oxford Scales, PA-C  cetirizine (ZYRTEC ALLERGY) 10 MG tablet Take 1 tablet (10 mg total) by mouth at bedtime. 02/26/21 05/27/21 Yes Erica Oxford Scales, PA-C  diphenhydrAMINE (BENADRYL) 25 mg capsule Take 25 mg by mouth every 6 (six) hours as needed.   Yes [provider]  EPINEPHrine 0.3 mg/0.3 mL IJ SOAJ injection For acute onset of lip swelling & airway obstruction, inject 1 pen into thigh muscle, call 911 & note time of 1st injection.  Wait 5 minutes.  If symptoms have not completely resolved, give 2nd injection in other thigh muscle &  note the time of second injection. 02/26/21  Yes Erica Oxford Scales, PA-C  famotidine (PEPCID) 40 MG tablet Take 1 tablet (40 mg total) by mouth daily with breakfast. 02/26/21 05/27/21 Yes Erica Oxford Scales, PA-C  methylPREDNISolone (MEDROL) 4 MG tablet Take 10 tabs by mouth daily for 1 day, THEN 9 tabs daily for 1 day, THEN 8 tabs daily for 1 day, THEN 7 tabs daily for 1 day, THEN 6 tabs daily for 1 day, THEN 5 tabs daily for 1 day, THEN 4 tabs daily for 1 day, THEN 3 tabs daily for 1 day, THEN 2 tabs daily for 1 day, THEN 1 tab daily for 1 day. 02/26/21 03/08/21 Yes Erica Oxford Scales, PA-C  montelukast (SINGULAIR) 10 MG tablet Take 1 tablet (10 mg total) by mouth at bedtime. 02/26/21 05/27/21 Yes Erica Oxford Scales, PA-C   Multiple Vitamin (MULTIVITAMIN) LIQD Take 5 mLs by mouth daily.    [provider]  Naproxen Sodium (ALEVE PO) Take by mouth as needed.    [provider]  vitamin C (ASCORBIC ACID) 250 MG tablet Take 250 mg by mouth daily.    [provider]    Family History Family History  Problem Relation Age of Onset   Kidney failure Mother    Hypertension Mother    Colon polyps Neg Hx    Colon cancer Neg Hx    Esophageal cancer Neg Hx    Rectal cancer Neg Hx    Stomach cancer Neg Hx    Social History Social History   Tobacco Use   Smoking status: Never   Smokeless tobacco: Never  Vaping Use   Vaping Use: Never used  Substance Use Topics   Alcohol use: Not Currently   Drug use: Never   Allergies   Patient has no known allergies.  Review of Systems Review of Systems Pertinent findings noted in history of present illness.   Physical Exam Triage Vital Signs ED Triage Vitals  Enc Vitals Group     BP 01/04/21 0827 (!) 147/82     Pulse Rate 01/04/21 0827 72     Resp 01/04/21 0827 18     Temp 01/04/21 0827 98.3 F (36.8 C)     Temp Source 01/04/21 0827 Oral     SpO2 01/04/21 0827 98 %     Weight --      Height --      Head Circumference --      Peak Flow --      Pain Score 01/04/21 0826 5     Pain Loc --      Pain Edu? --      Excl. in [place]? --   No data found.  Updated Vital Signs BP (!) 172/97 (BP Location: Right Arm) Comment: Provider aware   Pulse (!) 50    Temp 97.9 F (36.6 C) (Oral)    Resp 20    SpO2 98%   Physical Exam Vitals and nursing note reviewed.  Constitutional:      General: She is not in acute distress.    Appearance: Normal appearance. She is not ill-appearing.  HENT:     Head: Normocephalic and atraumatic.     Mouth/Throat:     Comments: Lower lip with significant swelling, left side greater than right, lip is easily twice the size of normal.  No significant swelling of tongue or posterior pharynx appreciated. Eyes:      General: Lids are normal.  Right eye: No discharge.        Left eye: No discharge.     Extraocular Movements: Extraocular movements intact.     Conjunctiva/sclera: Conjunctivae normal.     Right eye: Right conjunctiva is not injected.     Left eye: Left conjunctiva is not injected.  Neck:     Trachea: Trachea and phonation normal.  Cardiovascular:     Rate and Rhythm: Normal rate and regular rhythm.     Pulses: Normal pulses.     Heart sounds: Normal heart sounds. No murmur heard.   No friction rub. No gallop.  Pulmonary:     Effort: Pulmonary effort is normal. No accessory muscle usage, prolonged expiration or respiratory distress.     Breath sounds: Normal breath sounds. No stridor, decreased air movement or transmitted upper airway sounds. No decreased breath sounds, wheezing, rhonchi or rales.  Chest:     Chest wall: No tenderness.  Musculoskeletal:        General: Normal range of motion.     Cervical back: Normal range of motion and neck supple. Normal range of motion.  Lymphadenopathy:     Cervical: No cervical adenopathy.  Skin:    General: Skin is warm and dry.     Findings: Rash (Urticarial rash left lower quadrant.) present. No erythema.  Neurological:     General: No focal deficit present.     Mental Status: She is alert and oriented to person, place, and time.  Psychiatric:        Mood and Affect: Mood normal.        Behavior: Behavior normal.    Visual Acuity Right Eye Distance:   Left Eye Distance:   Bilateral Distance:    Right Eye Near:   Left Eye Near:    Bilateral Near:     UC Couse / Diagnostics / Procedures:    EKG  Radiology No results found.  Procedures Procedures (including critical care time)  UC Diagnoses / Final Clinical Impressions(s)   I have reviewed the triage vital signs and the nursing notes.  Pertinent labs & imaging results that were available during my care of the patient were reviewed by me and considered in my medical  decision making (see chart for details).    Final diagnoses:  Urticaria  Angioedema of lips, initial encounter  Essential hypertension  Greater than 20 minutes was spent discussing patient's history of hypertension, the long-term effects of uncontrolled hypertension, her family's medical history of kidney failure, risks of untreated angioedema, the importance of allergy testing and instructions on how and when to use an EpiPen.  Patient advised to begin Zyrtec and Singulair, continue famotidine.  2 EpiPen's provided for patient.  Patient advised to not take Benadryl unless absolutely necessary while taking Zyrtec.  Patient also provided with a long course of tapering dose of methylprednisolone.  ED Prescriptions     Medication Sig Dispense Auth. Provider   methylPREDNISolone (MEDROL) 4 MG tablet Take 10 tabs by mouth daily for 1 day, 9 tabs daily for 1 day, 8 tabs daily for 1 day, 7 tabs daily for 1 day, 6 tabs daily for 1 day, 5 tabs daily for 1 day, 4 tabs daily for 1 day, 3 tabs daily for 1 day, 2 tabs daily for 1 day, 1 tab daily for 1 day. 55 tablet Erica Oxford Scales, PA-C   Blood Pressure Monitoring (ADULT BLOOD PRESSURE CUFF LG) KIT Use as directed for daily blood pressure monitoring 1 kit [place],  Kamaile Zachow Scales, PA-C   EPINEPHrine 0.3 mg/0.3 mL IJ SOAJ injection For acute onset of lip swelling and airway obstruction, inject 1 pen into thigh muscle, call 911 and note time of first injection.  Wait 5 minutes.  If symptoms have not completely resolved, give second injection in other thigh muscle and note the time of second injection. 2 each Erica Oxford Scales, PA-C   cetirizine (ZYRTEC ALLERGY) 10 MG tablet Take 1 tablet (10 mg total) by mouth at bedtime. 30 tablet Erica Oxford Scales, PA-C   montelukast (SINGULAIR) 10 MG tablet Take 1 tablet (10 mg total) by mouth at bedtime. 30 tablet Erica Oxford Scales, PA-C   famotidine (PEPCID) 40 MG tablet Take 1 tablet (40 mg total) by  mouth daily with breakfast. 30 tablet Erica Oxford Scales, PA-C      PDMP not reviewed this encounter.  Pending results:  Labs Reviewed  CBC WITH DIFFERENTIAL/PLATELET    Medications Ordered in UC: Medications  methylPREDNISolone sodium succinate (SOLU-MEDROL) 125 mg/2 mL injection 125 mg (125 mg Intramuscular Given 02/26/21 0920)  cloNIDine (CATAPRES) tablet 0.2 mg (0.2 mg Oral Given 02/26/21 0954)    Disposition Upon Discharge:  Condition: stable for discharge home Home: take medications as prescribed; routine discharge instructions as discussed; follow up as advised.  Patient presented with an acute illness with associated systemic symptoms and significant discomfort requiring urgent management. In my opinion, this is a condition that a prudent lay person (someone who possesses an average knowledge of health and medicine) may potentially expect to result in complications if not addressed urgently such as respiratory distress, impairment of bodily function or dysfunction of bodily organs.   Routine symptom specific, illness specific and/or disease specific instructions were discussed with the patient and/or caregiver at length.   As such, the patient has been evaluated and assessed, work-up was performed and treatment was provided in alignment with urgent care protocols and evidence based medicine.  Patient/parent/caregiver has been advised that the patient may require follow up for further testing and treatment if the symptoms continue in spite of treatment, as clinically indicated and appropriate.  If the patient was tested for COVID-19, Influenza and/or RSV, then the patient/parent/guardian was advised to isolate at home pending the results of his/her diagnostic coronavirus test and potentially longer if theyre positive. I have also advised pt that if his/her COVID-19 test returns positive, it's recommended to self-isolate for at least 10 days after symptoms first appeared AND  until fever-free for 24 hours without fever reducer AND other symptoms have improved or resolved. Discussed self-isolation recommendations as well as instructions for household member/close contacts as per the [facility] and [place] DHHS, and also gave patient the [facility] packet with this information.  Patient/parent/caregiver has been advised to return to the [facility] or PCP in 3-5 days if no better; to PCP or the Emergency Department if new signs and symptoms develop, or if the current signs or symptoms continue to change or worsen for further workup, evaluation and treatment as clinically indicated and appropriate  The patient will follow up with their current PCP if and as advised. If the patient does not currently have a PCP we will assist them in obtaining one.   The patient may need specialty follow up if the symptoms continue, in spite of conservative treatment and management, for further workup, evaluation, consultation and treatment as clinically indicated and appropriate.   Patient/parent/caregiver verbalized understanding and agreement of plan as discussed.  All questions were addressed during visit.  Please see discharge instructions below for further details of plan.  Discharge Instructions:   Discharge Instructions      If you ever notice that your lips are swelling or feel that your upper airways constricting, you must use your epinephrine pen as soon as possible.  Inject the first pen into your thigh muscle and call 911, you can inject strength your clothing.  Note the time of the first injection.  If you have not had complete resolution of your symptoms after 5 minutes, give second injection in your other thigh muscle.  Note the time of your second injection and wait for EMS to arrive.  Be sure you carry both pens with you at all times, leaving them at home will do you no good.  I highly recommend that you consider having allergy testing done even if you do wait until the beginning of the year so  that you can use up your deductible earlier than have the rest of the year to enjoy zero co-pays.  Personally, I recommend [facility] in [place], Dr. Pablo Lawrence is very good.  Their phone number is [phone].  To prevent urticaria and swelling of your lips returning, he has begin Zyrtec and Singulair every night.  You are also welcome to continue famotidine.  I provided you with a prescription famotidine as well, I prefer the single day dosing of 40 mg in the morning.  To address the swelling and rash that you are having at this time, you received an injection of methylprednisolone 125 mg in the office.  As we talked today, it was noticeable that your lip swelling began to decrease and uncomfortable that it will continue to do so over the next 4 to 6 hours.  I also recommend that you begin a tapering dose of steroids over the next 10 days.  Please take them exactly as prescribed with your breakfast meal every morning, please begin them tomorrow morning.  Because we gave you such a high dose of steroids in the office and because your blood pressure was so elevated when you arrived, we gave you a blood pressure lowering medication called clonidine 0.2 mg in the office.  This medication usually lasts about 12 hours.  Please reach out to your internal medicine provider to let them know that you were here, that your blood pressure was elevated and that you required clonidine.  I provided you with information about healthy eating for hypertension and for diabetes (prediabetes).  As we discussed, prolonged, uncontrolled hypertension can destroy your kidneys and ultimately cause you to end up on dialysis, this is not something anyone wants and certainly nothing that any provider recommends further patients.  I strongly recommend that you consider starting blood pressure medications while you are working on lifestyle changes, losing weight and getting more exercise.  Thank you  so much for visiting urgent care today, I appreciate your waiting.  Thank you so much for the work you do for [facility] health, I really do like my badge, I promise!  If you are not able to get in with your primary care provider in the next week or so, please feel free to come back to follow-up, it is important that somebody lays eyes on you at least once more to make sure you are doing okay.    This office note has been dictated using Museum/gallery curator.  Unfortunately, and despite my best efforts, this method of dictation can sometimes lead to occasional  typographical or grammatical errors.  I apologize in advance if this occurs.     Erica Oxford Scales, PA-C 02/26/21 1406

## 2021-02-26 NOTE — Discharge Instructions (Addendum)
If you ever notice that your lips are swelling or feel that your upper airways constricting, you must use your epinephrine pen as soon as possible.  Inject the first pen into your thigh muscle and call 911, you can inject strength your clothing.  Note the time of the first injection.  If you have not had complete resolution of your symptoms after 5 minutes, give second injection in your other thigh muscle.  Note the time of your second injection and wait for EMS to arrive.  Be sure you carry both pens with you at all times, leaving them at home will do you no good.  I highly recommend that you consider having allergy testing done even if you do wait until the beginning of the year so that you can use up your deductible earlier than have the rest of the year to enjoy zero co-pays.  Personally, I recommend [facility] [place] in [place], Dr. Edwyna Ready is very good.  Their phone number is [phone].  To prevent urticaria and swelling of your lips returning, he has begin Zyrtec and Singulair every night.  You are also welcome to continue famotidine.  I provided you with a prescription famotidine as well, I prefer the single day dosing of 40 mg in the morning.  To address the swelling and rash that you are having at this time, you received an injection of methylprednisolone 125 mg in the office.  As we talked today, it was noticeable that your lip swelling began to decrease and uncomfortable that it will continue to do so over the next 4 to 6 hours.  I also recommend that you begin a tapering dose of steroids over the next 10 days.  Please take them exactly as prescribed with your breakfast meal every morning, please begin them tomorrow morning.  Because we gave you such a high dose of steroids in the office and because your blood pressure was so elevated when you arrived, we gave you a blood pressure lowering medication called clonidine 0.2 mg in the office.  This medication usually  lasts about 12 hours.  Please reach out to your internal medicine provider to let them know that you were here, that your blood pressure was elevated and that you required clonidine.  I provided you with information about healthy eating for hypertension and for diabetes (prediabetes).  As we discussed, prolonged, uncontrolled hypertension can destroy your kidneys and ultimately cause you to end up on dialysis, this is not something anyone wants and certainly nothing that any provider recommends further patients.  I strongly recommend that you consider starting blood pressure medications while you are working on lifestyle changes, losing weight and getting more exercise.  Thank you so much for visiting urgent care today, I appreciate your waiting.  Thank you so much for the work you do for [facility] health, I really do like my badge, I promise!  If you are not able to get in with your primary care provider in the next week or so, please feel free to come back to follow-up, it is important that somebody lays eyes on you at least once more to make sure you are doing okay.

## 2021-02-27 ENCOUNTER — Encounter: Payer: Self-pay | Admitting: Physician Assistant

## 2021-02-27 ENCOUNTER — Other Ambulatory Visit (HOSPITAL_COMMUNITY): Payer: Self-pay

## 2021-02-27 LAB — CBC WITH DIFFERENTIAL/PLATELET
Basophils Absolute: 0 10*3/uL (ref 0.0–0.2)
Basos: 1 %
EOS (ABSOLUTE): 0.1 10*3/uL (ref 0.0–0.4)
Eos: 2 %
Hematocrit: 44.7 % (ref 34.0–46.6)
Hemoglobin: 15.3 g/dL (ref 11.1–15.9)
Immature Grans (Abs): 0 10*3/uL (ref 0.0–0.1)
Immature Granulocytes: 0 %
Lymphocytes Absolute: 2.2 10*3/uL (ref 0.7–3.1)
Lymphs: 36 %
MCH: 30.3 pg (ref 26.6–33.0)
MCHC: 34.2 g/dL (ref 31.5–35.7)
MCV: 89 fL (ref 79–97)
Monocytes Absolute: 0.6 10*3/uL (ref 0.1–0.9)
Monocytes: 10 %
Neutrophils Absolute: 3.2 10*3/uL (ref 1.4–7.0)
Neutrophils: 51 %
Platelets: 308 10*3/uL (ref 150–450)
RBC: 5.05 x10E6/uL (ref 3.77–5.28)
RDW: 13 % (ref 11.7–15.4)
WBC: 6.2 10*3/uL (ref 3.4–10.8)

## 2021-03-20 ENCOUNTER — Other Ambulatory Visit: Payer: Self-pay

## 2021-03-20 ENCOUNTER — Encounter: Payer: Self-pay | Admitting: Nurse Practitioner

## 2021-03-20 ENCOUNTER — Other Ambulatory Visit (HOSPITAL_COMMUNITY): Payer: Self-pay

## 2021-03-20 ENCOUNTER — Ambulatory Visit: Payer: 59 | Admitting: Nurse Practitioner

## 2021-03-20 VITALS — BP 177/85 | HR 75 | Temp 98.1°F | Ht 62.0 in | Wt 275.2 lb

## 2021-03-20 DIAGNOSIS — R7303 Prediabetes: Secondary | ICD-10-CM | POA: Diagnosis not present

## 2021-03-20 DIAGNOSIS — T7840XD Allergy, unspecified, subsequent encounter: Secondary | ICD-10-CM

## 2021-03-20 DIAGNOSIS — I10 Essential (primary) hypertension: Secondary | ICD-10-CM | POA: Diagnosis not present

## 2021-03-20 DIAGNOSIS — Z6841 Body Mass Index (BMI) 40.0 and over, adult: Secondary | ICD-10-CM | POA: Diagnosis not present

## 2021-03-20 MED ORDER — AMLODIPINE BESYLATE 5 MG PO TABS
5.0000 mg | ORAL_TABLET | Freq: Every day | ORAL | 1 refills | Status: DC
  Filled 2021-03-20: qty 90, 90d supply, fill #0

## 2021-03-20 NOTE — Patient Instructions (Signed)
Managing Your Hypertension Hypertension, also called high blood pressure, is when the force of the blood pressing against the walls of the arteries is too strong. Arteries are blood vessels that carry blood from your heart throughout your body. Hypertension forces the heart to work harder to pump blood and may cause the arteries to become narrow or stiff. Understanding blood pressure readings Your personal target blood pressure may vary depending on your medical conditions, your age, and other factors. A blood pressure reading includes a higher number over a lower number. Ideally, your blood pressure should be below 120/80. You should know that: The first, or top, number is called the systolic pressure. It is a measure of the pressure in your arteries as your heart beats. The second, or bottom number, is called the diastolic pressure. It is a measure of the pressure in your arteries as the heart relaxes. Blood pressure is classified into four stages. Based on your blood pressure reading, your health care provider may use the following stages to determine what type of treatment you need, if any. Systolic pressure and diastolic pressure are measured in a unit called mmHg. Normal Systolic pressure: below 120. Diastolic pressure: below 80. Elevated Systolic pressure: 120-129. Diastolic pressure: below 80. Hypertension stage 1 Systolic pressure: 130-139. Diastolic pressure: 80-89. Hypertension stage 2 Systolic pressure: 140 or above. Diastolic pressure: 90 or above. How can this condition affect me? Managing your hypertension is an important responsibility. Over time, hypertension can damage the arteries and decrease blood flow to important parts of the body, including the brain, heart, and kidneys. Having untreated or uncontrolled hypertension can lead to: A heart attack. A stroke. A weakened blood vessel (aneurysm). Heart failure. Kidney damage. Eye damage. Metabolic syndrome. Memory and  concentration problems. Vascular dementia. What actions can I take to manage this condition? Hypertension can be managed by making lifestyle changes and possibly by taking medicines. Your health care provider will help you make a plan to bring your blood pressure within a normal range. Nutrition  Eat a diet that is high in fiber and potassium, and low in salt (sodium), added sugar, and fat. An example eating plan is called the Dietary Approaches to Stop Hypertension (DASH) diet. To eat this way: Eat plenty of fresh fruits and vegetables. Try to fill one-half of your plate at each meal with fruits and vegetables. Eat whole grains, such as whole-wheat pasta, brown rice, or whole-grain bread. Fill about one-fourth of your plate with whole grains. Eat low-fat dairy products. Avoid fatty cuts of meat, processed or cured meats, and poultry with skin. Fill about one-fourth of your plate with lean proteins such as fish, chicken without skin, beans, eggs, and tofu. Avoid pre-made and processed foods. These tend to be higher in sodium, added sugar, and fat. Reduce your daily sodium intake. Most people with hypertension should eat less than 1,500 mg of sodium a day. Lifestyle  Work with your health care provider to maintain a healthy body weight or to lose weight. Ask what an ideal weight is for you. Get at least 30 minutes of exercise that causes your heart to beat faster (aerobic exercise) most days of the week. Activities may include walking, swimming, or biking. Include exercise to strengthen your muscles (resistance exercise), such as weight lifting, as part of your weekly exercise routine. Try to do these types of exercises for 30 minutes at least 3 days a week. Do not use any products that contain nicotine or tobacco, such as cigarettes, e-cigarettes,   and chewing tobacco. If you need help quitting, ask your health care provider. Control any long-term (chronic) conditions you have, such as high  cholesterol or diabetes. Identify your sources of stress and find ways to manage stress. This may include meditation, deep breathing, or making time for fun activities. Alcohol use Do not drink alcohol if: Your health care provider tells you not to drink. You are pregnant, may be pregnant, or are planning to become pregnant. If you drink alcohol: Limit how much you use to: 0-1 drink a day for women. 0-2 drinks a day for men. Be aware of how much alcohol is in your drink. In the [place] one drink equals one 12 oz bottle of beer (355 mL), one 5 oz glass of wine (148 mL), or one 1 oz glass of hard liquor (44 mL). Medicines Your health care provider may prescribe medicine if lifestyle changes are not enough to get your blood pressure under control and if: Your systolic blood pressure is 130 or higher. Your diastolic blood pressure is 80 or higher. Take medicines only as told by your health care provider. Follow the directions carefully. Blood pressure medicines must be taken as told by your health care provider. The medicine does not work as well when you skip doses. Skipping doses also puts you at risk for problems. Monitoring Before you monitor your blood pressure: Do not smoke, drink caffeinated beverages, or exercise within 30 minutes before taking a measurement. Use the bathroom and empty your bladder (urinate). Sit quietly for at least 5 minutes before taking measurements. Monitor your blood pressure at home as told by your health care provider. To do this: Sit with your back straight and supported. Place your feet flat on the floor. Do not cross your legs. Support your arm on a flat surface, such as a table. Make sure your upper arm is at heart level. Each time you measure, take two or three readings one minute apart and record the results. You may also need to have your blood pressure checked regularly by your health care provider. General information Talk with your health care  provider about your diet, exercise habits, and other lifestyle factors that may be contributing to hypertension. Review all the medicines you take with your health care provider because there may be side effects or interactions. Keep all visits as told by your health care provider. Your health care provider can help you create and adjust your plan for managing your high blood pressure. Where to find more information National Heart, Lung, and Blood Institute: www.nhlbi.nih.gov American Heart Association: www.heart.org Contact a health care provider if: You think you are having a reaction to medicines you have taken. You have repeated (recurrent) headaches. You feel dizzy. You have swelling in your ankles. You have trouble with your vision. Get help right away if: You develop a severe headache or confusion. You have unusual weakness or numbness, or you feel faint. You have severe pain in your chest or abdomen. You vomit repeatedly. You have trouble breathing. These symptoms may represent a serious problem that is an emergency. Do not wait to see if the symptoms will go away. Get medical help right away. Call your local emergency services (911 in the [place] Do not drive yourself to the hospital. Summary Hypertension is when the force of blood pumping through your arteries is too strong. If this condition is not controlled, it may put you at risk for serious complications. Your personal target blood pressure may vary depending on   your medical conditions, your age, and other factors. For most people, a normal blood pressure is less than 120/80. Hypertension is managed by lifestyle changes, medicines, or both. Lifestyle changes to help manage hypertension include losing weight, eating a healthy, low-sodium diet, exercising more, stopping smoking, and limiting alcohol. This information is not intended to replace advice given to you by your health care provider. Make sure you discuss any questions  you have with your health care provider. Document Revised: 03/14/2019 Document Reviewed: 01/25/2019 Elsevier Patient Education  2022 Elsevier Inc.  

## 2021-03-20 NOTE — Progress Notes (Signed)
[address] [place], [place]  [address] Phone:  [phone]   Fax:  [phone]   Established Patient Office Visit  Subjective:  Patient ID: Erica Ferguson, female    DOB: 09-18-1965  Age: 56 y.o. MRN: [id number]  CC:  Chief Complaint  Patient presents with   Follow-up    Pt is here today to discuss her lip swelling over the holidays. Pt states that she went to urgent care and they thought that she had an allergic reaction to something so she was given an epi-pen and and steroid injection and pills for home.  Pt states that she has never seen and allergy specialist or had an allergy test done.    HPI Erica Ferguson presents for follow up. She  has a past medical history of Allergy.   She is following up today from a recent Urgent care (UC) visit. She reports in her lip swelling. The swelling was only in her bottom lip. She reports the symptoms on and off for 2 weeks. She she took benadryl.Her breathing was a little off on the day of the UC visit  However she did not no really note it because she was not asked. She denies any feeling of throat tightness or chest tightness. She did have whelps from her neck down. She reports that she had not eaten that day. She did not eat until evening.  She was drinking water and juice. She is not aware of anything new products. She has noted some increased stress and anxiety. She did try the gummie version for the hair skin and nail vitamin but she only did this once.  She has noticed that she has not been able to eat seafood since being here in [place].  She denies an history of allergies. She is ready to change. She is concern about her BP. It was elevated at the UC visit 200/99. She has had elevated BP in the past however was not ready for any treatment. She endorses a fear of taking medication. She lives alone and has heard all bad side effects with taking BP medication; dizziness and not feeling well. She is concern that  the lip swelling maybe related to uncontrolled HTN.    Past Medical History:  Diagnosis Date   Allergy    seasonal allergies    Past Surgical History:  Procedure Laterality Date   BREAST REDUCTION SURGERY  1989   REDUCTION MAMMAPLASTY     WISDOM TOOTH EXTRACTION Bilateral 1993    Family History  Problem Relation Age of Onset   Kidney failure Mother    Hypertension Mother    Colon polyps Neg Hx    Colon cancer Neg Hx    Esophageal cancer Neg Hx    Rectal cancer Neg Hx    Stomach cancer Neg Hx     Social History   Socioeconomic History   Marital status: Single    Spouse name: Not on file   Number of children: Not on file   Years of education: Not on file   Highest education level: Not on file  Occupational History   Not on file  Tobacco Use   Smoking status: Never   Smokeless tobacco: Never  Vaping Use   Vaping Use: Never used  Substance and Sexual Activity   Alcohol use: Not Currently   Drug use: Never   Sexual activity: Not Currently  Other Topics Concern   Not on file  Social  History Narrative   Not on file   Social Determinants of Health   Financial Resource Strain: Not on file  Food Insecurity: Not on file  Transportation Needs: Not on file  Physical Activity: Not on file  Stress: Not on file  Social Connections: Not on file  Intimate Partner Violence: Not on file    Outpatient Medications Prior to Visit  Medication Sig Dispense Refill   Blood Pressure Monitoring (ADULT BLOOD PRESSURE CUFF LG) KIT Use as directed for daily blood pressure monitoring 1 kit 0   cetirizine (ZYRTEC ALLERGY) 10 MG tablet Take 1 tablet (10 mg total) by mouth at bedtime. 30 tablet 2   diphenhydrAMINE (BENADRYL) 25 mg capsule Take 25 mg by mouth every 6 (six) hours as needed.     EPINEPHrine 0.3 mg/0.3 mL IJ SOAJ injection For acute onset of lip swelling and airway obstruction, inject 1 pen into thigh muscle, call 911 and note time of first injection.  Wait 5 minutes.  If  symptoms have not completely resolved, give second injection in other thigh muscle and note the time of second injection. 2 each 5   famotidine (PEPCID) 40 MG tablet Take 1 tablet (40 mg total) by mouth daily with breakfast. 30 tablet 2   montelukast (SINGULAIR) 10 MG tablet Take 1 tablet (10 mg total) by mouth at bedtime. 30 tablet 2   Multiple Vitamin (MULTIVITAMIN) LIQD Take 5 mLs by mouth daily.     Naproxen Sodium (ALEVE PO) Take by mouth as needed.     vitamin C (ASCORBIC ACID) 250 MG tablet Take 250 mg by mouth daily.     No facility-administered medications prior to visit.    No Known Allergies  ROS Review of Systems  Skin:        Slight change in size to top lip only     Objective:    Physical Exam Constitutional:      Appearance: She is obese.  HENT:     Head: Normocephalic and atraumatic.     Nose: Nose normal.     Mouth/Throat:     Mouth: Mucous membranes are moist.  Cardiovascular:     Rate and Rhythm: Normal rate and regular rhythm.     Pulses: Normal pulses.     Heart sounds: Normal heart sounds.  Pulmonary:     Effort: Pulmonary effort is normal.     Breath sounds: Normal breath sounds.  Abdominal:     Palpations: Abdomen is soft.  Musculoskeletal:        General: Normal range of motion.     Cervical back: Normal range of motion.     Right lower leg: No edema.     Left lower leg: No edema.  Skin:    General: Skin is warm and dry.     Capillary Refill: Capillary refill takes less than 2 seconds.  Neurological:     General: No focal deficit present.     Mental Status: She is alert and oriented to person, place, and time.  Psychiatric:        Mood and Affect: Mood normal.        Behavior: Behavior normal.        Thought Content: Thought content normal.        Judgment: Judgment normal.    BP (!) 177/85    Pulse 75    Temp 98.1 F (36.7 C)    Ht _0  (1.575 m)    Wt 275 lb 3.2 oz (  124.8 kg)    SpO2 98%    BMI 50.33 kg/m  Wt Readings from Last 3  Encounters:  03/20/21 275 lb 3.2 oz (124.8 kg)  10/29/20 258 lb 0.4 oz (117 kg)  08/21/20 270 lb (122.5 kg)     Health Maintenance Due  Topic Date Due   URINE MICROALBUMIN  Never done   PAP SMEAR-Modifier  Never done    There are no preventive care reminders to display for this patient.  Lab Results  Component Value Date   TSH 0.779 08/03/2019   Lab Results  Component Value Date   WBC 6.2 02/26/2021   HGB 15.3 02/26/2021   HCT 44.7 02/26/2021   MCV 89 02/26/2021   PLT 308 02/26/2021   Lab Results  Component Value Date   NA 138 08/03/2019   K 4.2 08/03/2019   GLUCOSE 92 08/03/2019   BUN 9 08/03/2019   CREATININE 0.89 08/03/2019   BILITOT 0.5 08/03/2019   ALKPHOS 63 08/03/2019   AST 13 08/03/2019   PROT 7.5 08/03/2019   ALBUMIN 4.0 08/03/2019   CALCIUM 9.2 08/03/2019   Lab Results  Component Value Date   CHOL 185 08/03/2019   Lab Results  Component Value Date   HDL 46 08/03/2019   Lab Results  Component Value Date   LDLCALC 122 (H) 08/03/2019   Lab Results  Component Value Date   TRIG 94 08/03/2019   Lab Results  Component Value Date   CHOLHDL 4.0 08/03/2019   Lab Results  Component Value Date   HGBA1C 6.0 (A) 10/29/2020      Assessment & Plan:   Problem List Items Addressed This Visit       Other   Prediabetes Consider home glucose monitoring Weight loss at least 5% of current body weight is can be achieved with lifestyle modification dietary changes and regular daily exercise Encourage blood pressure control goal <120/80 and maintaining total cholesterol <200 Follow-up every 3 to 6 months for reevaluation Education material provided    Relevant Orders   Microalbumin, urine   Morbid obesity with BMI of 45.0-49.9, adult ([place]) Obesity with BMI and comorbidities as noted above.  Discussed proper diet (low fat, low sodium, high fiber) with patient.   Discussed need for regular exercise (3 times per week, 20 minutes per session) with  patient.    Relevant Orders   Microalbumin, urine   Other Visit Diagnoses     Allergic reaction, subsequent encounter    -  Primary Further evaluation with allergy testing  Declined referral to allergy and immunology at this time.    Relevant Orders   Milk IgE   Allergen Gluten f79   Beef IgE   Allergen Profile, Food-Meat   Allergen Profile, Nuts x7   Allergy Panel 19, [employer]   Primary hypertension     Persistent Started Amlodipine 5 mg  Discussed side effects and any concerns to call office Encouraged on going compliance with current medication regimen Encouraged home monitoring and recording BP <130/80 Eating a heart-healthy diet with less salt Encouraged regular physical activity  Recommend Weight loss     Relevant Medications   amLODipine (NORVASC) 5 MG tablet       Meds ordered this encounter  Medications   amLODipine (NORVASC) 5 MG tablet    Sig: Take 1 tablet (5 mg total) by mouth daily.    Dispense:  90 tablet    Refill:  1    Order Specific Question:   Supervising  Provider    Answer:   Tresa Garter [[id number]]    Follow-up: Return in about 4 weeks (around 04/17/2021).    Vevelyn Francois, NP

## 2021-03-21 ENCOUNTER — Encounter: Payer: Self-pay | Admitting: Nurse Practitioner

## 2021-03-22 LAB — ALLERGENS(7)
Brazil Nut IgE: <0.1 kU/L
F020-IgE Almond: <0.1 kU/L
F202-IgE Cashew Nut: <0.1 kU/L
Hazelnut (Filbert) IgE: <0.1 kU/L
Peanut IgE: <0.1 kU/L
Pecan Nut IgE: <0.1 kU/L
Walnut IgE: <0.1 kU/L

## 2021-03-22 LAB — ALLERGEN BEEF: Beef IgE: <0.1 kU/L

## 2021-03-22 LAB — ALLERGY PANEL 19, SEAFOOD GROUP
Allergen Salmon IgE: <0.1 kU/L
Catfish: 0.11 kU/L — AB
Codfish IgE: <0.1 kU/L
F023-IgE Crab: 0.24 kU/L — AB
F080-IgE Lobster: <0.1 kU/L
Shrimp IgE: 0.14 kU/L — AB
Tuna: <0.1 kU/L

## 2021-03-22 LAB — MICROALBUMIN, URINE: Microalbumin, Urine: 20.6 ug/mL

## 2021-03-22 LAB — ALLERGEN PROFILE, FOOD-MEAT
Beef IgE: <0.1 kU/L
Chicken IgE: <0.1 kU/L
Pork IgE: 0.34 kU/L — AB

## 2021-03-22 LAB — ALLERGEN GLUTEN F79: Allergen Gluten IgE: <0.1 kU/L

## 2021-03-22 LAB — ALLERGEN MILK: Milk IgE: <0.1 kU/L

## 2021-04-10 ENCOUNTER — Other Ambulatory Visit: Payer: Self-pay

## 2021-04-10 ENCOUNTER — Encounter: Payer: Self-pay | Admitting: Nurse Practitioner

## 2021-04-10 ENCOUNTER — Ambulatory Visit: Payer: 59 | Admitting: Nurse Practitioner

## 2021-04-10 ENCOUNTER — Other Ambulatory Visit (HOSPITAL_COMMUNITY): Payer: Self-pay

## 2021-04-10 VITALS — BP 153/79 | HR 80 | Temp 97.9°F | Ht 62.0 in | Wt 277.2 lb

## 2021-04-10 DIAGNOSIS — R053 Chronic cough: Secondary | ICD-10-CM | POA: Diagnosis not present

## 2021-04-10 DIAGNOSIS — T7840XD Allergy, unspecified, subsequent encounter: Secondary | ICD-10-CM | POA: Diagnosis not present

## 2021-04-10 DIAGNOSIS — I10 Essential (primary) hypertension: Secondary | ICD-10-CM

## 2021-04-10 DIAGNOSIS — Z6841 Body Mass Index (BMI) 40.0 and over, adult: Secondary | ICD-10-CM | POA: Diagnosis not present

## 2021-04-10 MED ORDER — METHYLPREDNISOLONE 4 MG PO TBPK
ORAL_TABLET | ORAL | 0 refills | Status: AC
  Filled 2021-04-10: qty 21, 6d supply, fill #0

## 2021-04-10 MED ORDER — TRIAMCINOLONE ACETONIDE 0.1 % EX CREA
1.0000 "application " | TOPICAL_CREAM | Freq: Two times a day (BID) | CUTANEOUS | 5 refills | Status: DC
  Filled 2021-04-10: qty 30, 15d supply, fill #0
  Filled 2021-05-26: qty 30, 15d supply, fill #1

## 2021-04-10 NOTE — Patient Instructions (Signed)
Managing Your Hypertension Hypertension, also called high blood pressure, is when the force of the blood pressing against the walls of the arteries is too strong. Arteries are blood vessels that carry blood from your heart throughout your body. Hypertension forces the heart to work harder to pump blood and may cause the arteries to become narrow or stiff. Understanding blood pressure readings Your personal target blood pressure may vary depending on your medical conditions, your age, and other factors. A blood pressure reading includes a higher number over a lower number. Ideally, your blood pressure should be below 120/80. You should know that: The first, or top, number is called the systolic pressure. It is a measure of the pressure in your arteries as your heart beats. The second, or bottom number, is called the diastolic pressure. It is a measure of the pressure in your arteries as the heart relaxes. Blood pressure is classified into four stages. Based on your blood pressure reading, your health care provider may use the following stages to determine what type of treatment you need, if any. Systolic pressure and diastolic pressure are measured in a unit called mmHg. Normal Systolic pressure: below 120. Diastolic pressure: below 80. Elevated Systolic pressure: 120-129. Diastolic pressure: below 80. Hypertension stage 1 Systolic pressure: 130-139. Diastolic pressure: 80-89. Hypertension stage 2 Systolic pressure: 140 or above. Diastolic pressure: 90 or above. How can this condition affect me? Managing your hypertension is an important responsibility. Over time, hypertension can damage the arteries and decrease blood flow to important parts of the body, including the brain, heart, and kidneys. Having untreated or uncontrolled hypertension can lead to: A heart attack. A stroke. A weakened blood vessel (aneurysm). Heart failure. Kidney damage. Eye damage. Metabolic syndrome. Memory and  concentration problems. Vascular dementia. What actions can I take to manage this condition? Hypertension can be managed by making lifestyle changes and possibly by taking medicines. Your health care provider will help you make a plan to bring your blood pressure within a normal range. Nutrition  Eat a diet that is high in fiber and potassium, and low in salt (sodium), added sugar, and fat. An example eating plan is called the Dietary Approaches to Stop Hypertension (DASH) diet. To eat this way: Eat plenty of fresh fruits and vegetables. Try to fill one-half of your plate at each meal with fruits and vegetables. Eat whole grains, such as whole-wheat pasta, brown rice, or whole-grain bread. Fill about one-fourth of your plate with whole grains. Eat low-fat dairy products. Avoid fatty cuts of meat, processed or cured meats, and poultry with skin. Fill about one-fourth of your plate with lean proteins such as fish, chicken without skin, beans, eggs, and tofu. Avoid pre-made and processed foods. These tend to be higher in sodium, added sugar, and fat. Reduce your daily sodium intake. Most people with hypertension should eat less than 1,500 mg of sodium a day. Lifestyle  Work with your health care provider to maintain a healthy body weight or to lose weight. Ask what an ideal weight is for you. Get at least 30 minutes of exercise that causes your heart to beat faster (aerobic exercise) most days of the week. Activities may include walking, swimming, or biking. Include exercise to strengthen your muscles (resistance exercise), such as weight lifting, as part of your weekly exercise routine. Try to do these types of exercises for 30 minutes at least 3 days a week. Do not use any products that contain nicotine or tobacco, such as cigarettes, e-cigarettes,   and chewing tobacco. If you need help quitting, ask your health care provider. Control any long-term (chronic) conditions you have, such as high  cholesterol or diabetes. Identify your sources of stress and find ways to manage stress. This may include meditation, deep breathing, or making time for fun activities. Alcohol use Do not drink alcohol if: Your health care provider tells you not to drink. You are pregnant, may be pregnant, or are planning to become pregnant. If you drink alcohol: Limit how much you use to: 0-1 drink a day for women. 0-2 drinks a day for men. Be aware of how much alcohol is in your drink. In the [place] one drink equals one 12 oz bottle of beer (355 mL), one 5 oz glass of wine (148 mL), or one 1 oz glass of hard liquor (44 mL). Medicines Your health care provider may prescribe medicine if lifestyle changes are not enough to get your blood pressure under control and if: Your systolic blood pressure is 130 or higher. Your diastolic blood pressure is 80 or higher. Take medicines only as told by your health care provider. Follow the directions carefully. Blood pressure medicines must be taken as told by your health care provider. The medicine does not work as well when you skip doses. Skipping doses also puts you at risk for problems. Monitoring Before you monitor your blood pressure: Do not smoke, drink caffeinated beverages, or exercise within 30 minutes before taking a measurement. Use the bathroom and empty your bladder (urinate). Sit quietly for at least 5 minutes before taking measurements. Monitor your blood pressure at home as told by your health care provider. To do this: Sit with your back straight and supported. Place your feet flat on the floor. Do not cross your legs. Support your arm on a flat surface, such as a table. Make sure your upper arm is at heart level. Each time you measure, take two or three readings one minute apart and record the results. You may also need to have your blood pressure checked regularly by your health care provider. General information Talk with your health care  provider about your diet, exercise habits, and other lifestyle factors that may be contributing to hypertension. Review all the medicines you take with your health care provider because there may be side effects or interactions. Keep all visits as told by your health care provider. Your health care provider can help you create and adjust your plan for managing your high blood pressure. Where to find more information National Heart, Lung, and Blood Institute: www.nhlbi.nih.gov American Heart Association: www.heart.org Contact a health care provider if: You think you are having a reaction to medicines you have taken. You have repeated (recurrent) headaches. You feel dizzy. You have swelling in your ankles. You have trouble with your vision. Get help right away if: You develop a severe headache or confusion. You have unusual weakness or numbness, or you feel faint. You have severe pain in your chest or abdomen. You vomit repeatedly. You have trouble breathing. These symptoms may represent a serious problem that is an emergency. Do not wait to see if the symptoms will go away. Get medical help right away. Call your local emergency services (911 in the [place] Do not drive yourself to the hospital. Summary Hypertension is when the force of blood pumping through your arteries is too strong. If this condition is not controlled, it may put you at risk for serious complications. Your personal target blood pressure may vary depending on   your medical conditions, your age, and other factors. For most people, a normal blood pressure is less than 120/80. Hypertension is managed by lifestyle changes, medicines, or both. Lifestyle changes to help manage hypertension include losing weight, eating a healthy, low-sodium diet, exercising more, stopping smoking, and limiting alcohol. This information is not intended to replace advice given to you by your health care provider. Make sure you discuss any questions  you have with your health care provider. Document Revised: 03/14/2019 Document Reviewed: 01/25/2019 Elsevier Patient Education  2022 Elsevier Inc.  

## 2021-04-10 NOTE — Progress Notes (Signed)
Erica Ferguson, Erica Ferguson  [id number] Phone:  [phone]   Fax:  [phone]   Established Patient Office Visit  Subjective:  Patient ID: Erica Ferguson, female    DOB: 14-Nov-1965  Age: 56 y.o. MRN: [id number]  CC:  Chief Complaint  Patient presents with   Follow-up    Pt is here today for swelling in her lips and she states that she has not been eating any seafoods or other things that could have caused her lips to swell. Pt states that her lips swelling never went away but she would have flare ups off and on since her last visit til today. Pt also states that she has been feeling very fatigue.    HPI Erica Ferguson presents for follow up. She  has a past medical history of Allergy.    Erica Ferguson is in today for follow up for Hypertension. The current prescribed treatment is amlodipine. Compliance is reported without side effects questions increased daytime sleepiness. The adherence to DASH diet is being followed. An exercise regimen is not ongoing. There is a goal to continue to improve BP and overall health .Denies headache, dizziness, visual changes, chest pain, nausea, vomiting or any edema.   She continues to have hives and itching. She has multiple food sensitivities . She has been avoiding these foods. However this has not made any impact on her repeat reaction. She endorses lip swelling, shortness of breath, cough and fatigue. She works in an [employer]. She does not feel like this affects her. She is also stationed next to the hospital morgue. She has noticed that plastic/non latex (form) has caused a reaction from having her BP measured.   She does have some problems over the weekend. However does not feel like it is as bad being at home. She continues benadryl along with topical kenalog. She did have some success with previous steroid.  Past Medical History:  Diagnosis Date   Allergy    seasonal allergies    Past  Surgical History:  Procedure Laterality Date   BREAST REDUCTION SURGERY  1989   REDUCTION MAMMAPLASTY     WISDOM TOOTH EXTRACTION Bilateral 1993    Family History  Problem Relation Age of Onset   Kidney failure Mother    Hypertension Mother    Colon polyps Neg Hx    Colon cancer Neg Hx    Esophageal cancer Neg Hx    Rectal cancer Neg Hx    Stomach cancer Neg Hx     Social History   Socioeconomic History   Marital status: Single    Spouse name: Not on file   Number of children: Not on file   Years of education: Not on file   Highest education level: Not on file  Occupational History   Not on file  Tobacco Use   Smoking status: Never   Smokeless tobacco: Never  Vaping Use   Vaping Use: Never used  Substance and Sexual Activity   Alcohol use: Not Currently   Drug use: Never   Sexual activity: Not Currently  Other Topics Concern   Not on file  Social History Narrative   Not on file   Social Determinants of Health   Financial Resource Strain: Not on file  Food Insecurity: Not on file  Transportation Needs: Not on file  Physical Activity: Not on file  Stress: Not on file  Social Connections: Not on file  Intimate Partner  Violence: Not on file    Outpatient Medications Prior to Visit  Medication Sig Dispense Refill   amLODipine (NORVASC) 5 MG tablet Take 1 tablet (5 mg total) by mouth daily. 90 tablet 1   Blood Pressure Monitoring (ADULT BLOOD PRESSURE CUFF LG) KIT Use as directed for daily blood pressure monitoring 1 kit 0   cetirizine (ZYRTEC ALLERGY) 10 MG tablet Take 1 tablet (10 mg total) by mouth at bedtime. 30 tablet 2   diphenhydrAMINE (BENADRYL) 25 mg capsule Take 25 mg by mouth every 6 (six) hours as needed.     EPINEPHrine 0.3 mg/0.3 mL IJ SOAJ injection For acute onset of lip swelling and airway obstruction, inject 1 pen into thigh muscle, call 911 and note time of first injection.  Wait 5 minutes.  If symptoms have not completely resolved, give second  injection in other thigh muscle and note the time of second injection. 2 each 5   famotidine (PEPCID) 40 MG tablet Take 1 tablet (40 mg total) by mouth daily with breakfast. 30 tablet 2   montelukast (SINGULAIR) 10 MG tablet Take 1 tablet (10 mg total) by mouth at bedtime. 30 tablet 2   Multiple Vitamin (MULTIVITAMIN) LIQD Take 5 mLs by mouth daily.     Naproxen Sodium (ALEVE PO) Take by mouth as needed.     vitamin C (ASCORBIC ACID) 250 MG tablet Take 250 mg by mouth daily.     triamcinolone cream (KENALOG) 0.1 % Apply 1 application topically 2 (two) times daily.     No facility-administered medications prior to visit.    Allergies  Allergen Reactions   Pork Allergy Hives and Swelling   Shellfish-Derived Products Hives and Swelling    ROS Review of Systems  Constitutional:  Positive for fatigue.  Respiratory:  Positive for cough and shortness of breath.      Objective:    Physical Exam Constitutional:      Appearance: She is obese.  HENT:     Head: Normocephalic and atraumatic.     Nose: Nose normal.     Mouth/Throat:     Mouth: Mucous membranes are moist.  Cardiovascular:     Rate and Rhythm: Normal rate and regular rhythm.     Pulses: Normal pulses.     Heart sounds: Normal heart sounds.  Pulmonary:     Effort: Pulmonary effort is normal.     Breath sounds: Normal breath sounds.  Abdominal:     Palpations: Abdomen is soft.  Musculoskeletal:        General: Normal range of motion.     Cervical back: Normal range of motion.     Right lower leg: No edema.     Left lower leg: No edema.  Skin:    General: Skin is warm and dry.     Capillary Refill: Capillary refill takes less than 2 seconds.     Findings: Erythema present.     Comments: Hives to left upper arm with contact to BP cuff  Neurological:     General: No focal deficit present.     Mental Status: She is alert and oriented to person, place, and time.  Psychiatric:        Mood and Affect: Mood normal.         Behavior: Behavior normal.        Thought Content: Thought content normal.        Judgment: Judgment normal.    BP (!) 153/79    Pulse 80  Temp 97.9 F (36.6 C)    Ht 5' 2"  (1.575 m)    Wt 277 lb 3.2 oz (125.7 kg)    SpO2 99%    BMI 50.70 kg/m  Wt Readings from Last 3 Encounters:  04/10/21 277 lb 3.2 oz (125.7 kg)  03/20/21 275 lb 3.2 oz (124.8 kg)  10/29/20 258 lb 0.4 oz (117 kg)     Health Maintenance Due  Topic Date Due   PAP SMEAR-Modifier  Never done   COVID-19 Vaccine (2 - Booster for Janssen series) 01/12/2020    There are no preventive care reminders to display for this patient.  Lab Results  Component Value Date   TSH 0.779 08/03/2019   Lab Results  Component Value Date   WBC 6.2 02/26/2021   HGB 15.3 02/26/2021   HCT 44.7 02/26/2021   MCV 89 02/26/2021   PLT 308 02/26/2021   Lab Results  Component Value Date   NA 138 08/03/2019   K 4.2 08/03/2019   GLUCOSE 92 08/03/2019   BUN 9 08/03/2019   CREATININE 0.89 08/03/2019   BILITOT 0.5 08/03/2019   ALKPHOS 63 08/03/2019   AST 13 08/03/2019   PROT 7.5 08/03/2019   ALBUMIN 4.0 08/03/2019   CALCIUM 9.2 08/03/2019   Lab Results  Component Value Date   CHOL 185 08/03/2019   Lab Results  Component Value Date   HDL 46 08/03/2019   Lab Results  Component Value Date   LDLCALC 122 (H) 08/03/2019   Lab Results  Component Value Date   TRIG 94 08/03/2019   Lab Results  Component Value Date   CHOLHDL 4.0 08/03/2019   Lab Results  Component Value Date   HGBA1C 6.0 (A) 10/29/2020      Assessment & Plan:   Problem List Items Addressed This Visit       Other   Morbid obesity with BMI of 45.0-49.9, adult ([place] Obesity with BMI and comorbidities as noted above.  Discussed proper diet (low fat, low sodium, high fiber) with patient.   Discussed need for regular exercise (3 times per week, 20 minutes per session) with patient.    Other Visit Diagnoses     Primary hypertension    -   Primary Improving  Continue with current regimen.  No changes warranted. Good patient compliance.    Chronic cough     Persistent  CXR pending   Allergic reaction, subsequent encounter    [employer]  Repeat medrol dose pak     Relevant Orders   Ambulatory referral to Allergy   DG Chest 2 View       Meds ordered this encounter  Medications   triamcinolone cream (KENALOG) 0.1 %    Sig: Apply 1 application topically 2 (two) times daily.    Dispense:  30 g    Refill:  5   methylPREDNISolone (MEDROL) 4 MG TBPK tablet    Sig: Follow package instructions.    Dispense:  21 tablet    Refill:  0    Do not add to the "Automatic Refill" notification system.    Order Specific Question:   Supervising Provider    Answer:   Tresa Garter [[id number]]    Follow-up: Return for Appointment As Scheduled.    Vevelyn Francois, NP

## 2021-04-11 ENCOUNTER — Encounter: Payer: Self-pay | Admitting: Nurse Practitioner

## 2021-04-17 ENCOUNTER — Ambulatory Visit: Payer: 59 | Admitting: Nurse Practitioner

## 2021-04-18 ENCOUNTER — Ambulatory Visit (HOSPITAL_COMMUNITY)
Admission: RE | Admit: 2021-04-18 | Discharge: 2021-04-18 | Disposition: A | Discharge status: Home / Self Care | Payer: 59 | Source: Ambulatory Visit | Attending: Nurse Practitioner | Admitting: Nurse Practitioner

## 2021-04-18 ENCOUNTER — Other Ambulatory Visit: Payer: Self-pay

## 2021-04-18 DIAGNOSIS — R053 Chronic cough: Secondary | ICD-10-CM | POA: Insufficient documentation

## 2021-04-18 DIAGNOSIS — R0602 Shortness of breath: Secondary | ICD-10-CM | POA: Diagnosis not present

## 2021-05-27 ENCOUNTER — Other Ambulatory Visit (HOSPITAL_COMMUNITY): Payer: Self-pay

## 2021-05-30 ENCOUNTER — Other Ambulatory Visit (HOSPITAL_COMMUNITY): Payer: Self-pay

## 2021-05-30 ENCOUNTER — Ambulatory Visit: Payer: 59 | Admitting: Nurse Practitioner

## 2021-05-30 ENCOUNTER — Encounter: Payer: Self-pay | Admitting: Nurse Practitioner

## 2021-05-30 ENCOUNTER — Other Ambulatory Visit: Payer: Self-pay

## 2021-05-30 VITALS — BP 158/83 | Temp 97.7°F | Ht 62.0 in | Wt 280.6 lb

## 2021-05-30 DIAGNOSIS — R29818 Other symptoms and signs involving the nervous system: Secondary | ICD-10-CM | POA: Diagnosis not present

## 2021-05-30 DIAGNOSIS — J029 Acute pharyngitis, unspecified: Secondary | ICD-10-CM | POA: Diagnosis not present

## 2021-05-30 DIAGNOSIS — I10 Essential (primary) hypertension: Secondary | ICD-10-CM | POA: Diagnosis not present

## 2021-05-30 MED ORDER — HYDROCHLOROTHIAZIDE 25 MG PO TABS
25.0000 mg | ORAL_TABLET | Freq: Every day | ORAL | 3 refills | Status: DC
  Filled 2021-05-30: qty 90, 90d supply, fill #0

## 2021-05-30 NOTE — Patient Instructions (Signed)
Managing Your Hypertension Hypertension, also called high blood pressure, is when the force of the blood pressing against the walls of the arteries is too strong. Arteries are blood vessels that carry blood from your heart throughout your body. Hypertension forces the heart to work harder to pump blood and may cause the arteries to become narrow or stiff. Understanding blood pressure readings Your personal target blood pressure may vary depending on your medical conditions, your age, and other factors. A blood pressure reading includes a higher number over a lower number. Ideally, your blood pressure should be below 120/80. You should know that: The first, or top, number is called the systolic pressure. It is a measure of the pressure in your arteries as your heart beats. The second, or bottom number, is called the diastolic pressure. It is a measure of the pressure in your arteries as the heart relaxes. Blood pressure is classified into four stages. Based on your blood pressure reading, your health care provider may use the following stages to determine what type of treatment you need, if any. Systolic pressure and diastolic pressure are measured in a unit called mmHg. Normal Systolic pressure: below 120. Diastolic pressure: below 80. Elevated Systolic pressure: 120-129. Diastolic pressure: below 80. Hypertension stage 1 Systolic pressure: 130-139. Diastolic pressure: 80-89. Hypertension stage 2 Systolic pressure: 140 or above. Diastolic pressure: 90 or above. How can this condition affect me? Managing your hypertension is an important responsibility. Over time, hypertension can damage the arteries and decrease blood flow to important parts of the body, including the brain, heart, and kidneys. Having untreated or uncontrolled hypertension can lead to: A heart attack. A stroke. A weakened blood vessel (aneurysm). Heart failure. Kidney damage. Eye damage. Metabolic syndrome. Memory and  concentration problems. Vascular dementia. What actions can I take to manage this condition? Hypertension can be managed by making lifestyle changes and possibly by taking medicines. Your health care provider will help you make a plan to bring your blood pressure within a normal range. Nutrition  Eat a diet that is high in fiber and potassium, and low in salt (sodium), added sugar, and fat. An example eating plan is called the Dietary Approaches to Stop Hypertension (DASH) diet. To eat this way: Eat plenty of fresh fruits and vegetables. Try to fill one-half of your plate at each meal with fruits and vegetables. Eat whole grains, such as whole-wheat pasta, brown rice, or whole-grain bread. Fill about one-fourth of your plate with whole grains. Eat low-fat dairy products. Avoid fatty cuts of meat, processed or cured meats, and poultry with skin. Fill about one-fourth of your plate with lean proteins such as fish, chicken without skin, beans, eggs, and tofu. Avoid pre-made and processed foods. These tend to be higher in sodium, added sugar, and fat. Reduce your daily sodium intake. Most people with hypertension should eat less than 1,500 mg of sodium a day. Lifestyle  Work with your health care provider to maintain a healthy body weight or to lose weight. Ask what an ideal weight is for you. Get at least 30 minutes of exercise that causes your heart to beat faster (aerobic exercise) most days of the week. Activities may include walking, swimming, or biking. Include exercise to strengthen your muscles (resistance exercise), such as weight lifting, as part of your weekly exercise routine. Try to do these types of exercises for 30 minutes at least 3 days a week. Do not use any products that contain nicotine or tobacco, such as cigarettes, e-cigarettes,   and chewing tobacco. If you need help quitting, ask your health care provider. Control any long-term (chronic) conditions you have, such as high  cholesterol or diabetes. Identify your sources of stress and find ways to manage stress. This may include meditation, deep breathing, or making time for fun activities. Alcohol use Do not drink alcohol if: Your health care provider tells you not to drink. You are pregnant, may be pregnant, or are planning to become pregnant. If you drink alcohol: Limit how much you use to: 0-1 drink a day for women. 0-2 drinks a day for men. Be aware of how much alcohol is in your drink. In the [place] one drink equals one 12 oz bottle of beer (355 mL), one 5 oz glass of wine (148 mL), or one 1 oz glass of hard liquor (44 mL). Medicines Your health care provider may prescribe medicine if lifestyle changes are not enough to get your blood pressure under control and if: Your systolic blood pressure is 130 or higher. Your diastolic blood pressure is 80 or higher. Take medicines only as told by your health care provider. Follow the directions carefully. Blood pressure medicines must be taken as told by your health care provider. The medicine does not work as well when you skip doses. Skipping doses also puts you at risk for problems. Monitoring Before you monitor your blood pressure: Do not smoke, drink caffeinated beverages, or exercise within 30 minutes before taking a measurement. Use the bathroom and empty your bladder (urinate). Sit quietly for at least 5 minutes before taking measurements. Monitor your blood pressure at home as told by your health care provider. To do this: Sit with your back straight and supported. Place your feet flat on the floor. Do not cross your legs. Support your arm on a flat surface, such as a table. Make sure your upper arm is at heart level. Each time you measure, take two or three readings one minute apart and record the results. You may also need to have your blood pressure checked regularly by your health care provider. General information Talk with your health care  provider about your diet, exercise habits, and other lifestyle factors that may be contributing to hypertension. Review all the medicines you take with your health care provider because there may be side effects or interactions. Keep all visits as told by your health care provider. Your health care provider can help you create and adjust your plan for managing your high blood pressure. Where to find more information National Heart, Lung, and Blood Institute: www.nhlbi.nih.gov American Heart Association: www.heart.org Contact a health care provider if: You think you are having a reaction to medicines you have taken. You have repeated (recurrent) headaches. You feel dizzy. You have swelling in your ankles. You have trouble with your vision. Get help right away if: You develop a severe headache or confusion. You have unusual weakness or numbness, or you feel faint. You have severe pain in your chest or abdomen. You vomit repeatedly. You have trouble breathing. These symptoms may represent a serious problem that is an emergency. Do not wait to see if the symptoms will go away. Get medical help right away. Call your local emergency services (911 in the [place] Do not drive yourself to the hospital. Summary Hypertension is when the force of blood pumping through your arteries is too strong. If this condition is not controlled, it may put you at risk for serious complications. Your personal target blood pressure may vary depending on   your medical conditions, your age, and other factors. For most people, a normal blood pressure is less than 120/80. Hypertension is managed by lifestyle changes, medicines, or both. Lifestyle changes to help manage hypertension include losing weight, eating a healthy, low-sodium diet, exercising more, stopping smoking, and limiting alcohol. This information is not intended to replace advice given to you by your health care provider. Make sure you discuss any questions  you have with your health care provider. Document Revised: 03/14/2019 Document Reviewed: 01/25/2019 Elsevier Patient Education  2022 Elsevier Inc.  

## 2021-05-30 NOTE — Progress Notes (Signed)
? ?[facility] ?[address]  [address] ?Phone:  [phone]   Fax:  [phone] ? ? ?Established Patient Office Visit ? ?Subjective:  ?Patient ID: Erica Ferguson, female    DOB: 12/16/1965  Age: 56 y.o. MRN: [id number] ? ?CC:  ?Chief Complaint  ?Patient presents with  ? Follow-up  ?  Pt stated she has a cough, sore throat when she swallow,and voice is gone for the 2 weeks pt took Covid test it was negative. Pt took OTC medication gargled with warm salt water  ? ? ?HPI ?Erica Ferguson presents for follow up. She  has a past medical history of Allergy.  ? ?Upper Respiratory Infection ?Patient complains of symptoms of a URI. Symptoms include congestion and sore throat. Onset of symptoms was 2 weeks ago, and has been gradually improving since that time. Treatment to date: Otc treatments. She continues to have allergic reaction symptoms despite withholding foods that indicated allergy sensitivity.  She has been out of work periodically for theses symptoms. She is establishing care with allergy immunology for further evaluation of potential causes .  ? ?She is having some tightness in her hands and legs. She is compliant with her amlodipine 5 mg . Her BP is not completely controlled however improved. Denies headache, dizziness, visual changes, shortness of breath, dyspnea on exertion, chest pain, nausea, vomiting.  ? ? ?Past Medical History:  ?Diagnosis Date  ? Allergy   ? seasonal allergies  ? ? ?Past Surgical History:  ?Procedure Laterality Date  ? BREAST REDUCTION SURGERY  1989  ? REDUCTION MAMMAPLASTY    ? [place] EXTRACTION Bilateral 1993  ? ? ?Family History  ?Problem Relation Age of Onset  ? Kidney failure Mother   ? Hypertension Mother   ? Colon polyps Neg Hx   ? Colon cancer Neg Hx   ? Esophageal cancer Neg Hx   ? Rectal cancer Neg Hx   ? Stomach cancer Neg Hx   ? ? ?Social History  ? ?Socioeconomic History  ? Marital status: Single  ?  Spouse name: Not on file  ?  Number of children: Not on file  ? Years of education: Not on file  ? Highest education level: Not on file  ?Occupational History  ? Not on file  ?Tobacco Use  ? Smoking status: Never  ? Smokeless tobacco: Never  ?Vaping Use  ? Vaping Use: Never used  ?Substance and Sexual Activity  ? Alcohol use: Not Currently  ? Drug use: Never  ? Sexual activity: Not Currently  ?Other Topics Concern  ? Not on file  ?Social History Narrative  ? Not on file  ? ?Social Determinants of Health  ? ?Financial Resource Strain: Not on file  ?Food Insecurity: Not on file  ?Transportation Needs: Not on file  ?Physical Activity: Not on file  ?Stress: Not on file  ?Social Connections: Not on file  ?Intimate Partner Violence: Not on file  ? ? ?Outpatient Medications Prior to Visit  ?Medication Sig Dispense Refill  ? amLODipine (NORVASC) 5 MG tablet Take 1 tablet (5 mg total) by mouth daily. 90 tablet 1  ? Blood Pressure Monitoring (ADULT BLOOD PRESSURE CUFF LG) KIT Use as directed for daily blood pressure monitoring 1 kit 0  ? diphenhydrAMINE (BENADRYL) 25 mg capsule Take 25 mg by mouth every 6 (six) hours as needed.    ? cetirizine (ZYRTEC ALLERGY) 10 MG tablet Take 1 tablet (10 mg total) by mouth  at bedtime. 30 tablet 2  ? EPINEPHrine 0.3 mg/0.3 mL IJ SOAJ injection For acute onset of lip swelling and airway obstruction, inject 1 pen into thigh muscle, call 911 and note time of first injection.  Wait 5 minutes.  If symptoms have not completely resolved, give second injection in other thigh muscle and note the time of second injection. (Patient not taking: Reported on 05/30/2021) 2 each 5  ? famotidine (PEPCID) 40 MG tablet Take 1 tablet (40 mg total) by mouth daily with breakfast. 30 tablet 2  ? montelukast (SINGULAIR) 10 MG tablet Take 1 tablet (10 mg total) by mouth at bedtime. 30 tablet 2  ? Multiple Vitamin (MULTIVITAMIN) LIQD Take 5 mLs by mouth daily. (Patient not taking: Reported on 05/30/2021)    ? Naproxen Sodium (ALEVE PO) Take by  mouth as needed. (Patient not taking: Reported on 05/30/2021)    ? triamcinolone cream (KENALOG) 0.1 % Apply 1 application topically 2 (two) times daily. (Patient not taking: Reported on 05/30/2021) 30 g 5  ? vitamin C (ASCORBIC ACID) 250 MG tablet Take 250 mg by mouth daily. (Patient not taking: Reported on 05/30/2021)    ? ?No facility-administered medications prior to visit.  ? ? ?Allergies  ?Allergen Reactions  ? Pork Allergy Hives and Swelling  ? Shellfish-Derived Products Hives and Swelling  ? ? ?ROS ?Review of Systems ? ?  ?Objective:  ?  ?Physical Exam ?Constitutional:   ?   Appearance: She is obese.  ?HENT:  ?   Head: Normocephalic and atraumatic.  ?   Nose: Nose normal.  ?   Mouth/Throat:  ?   Mouth: Mucous membranes are moist.  ?Cardiovascular:  ?   Rate and Rhythm: Normal rate and regular rhythm.  ?   Pulses: Normal pulses.  ?   Heart sounds: Normal heart sounds.  ?Pulmonary:  ?   Effort: Pulmonary effort is normal.  ?   Breath sounds: Normal breath sounds.  ?Abdominal:  ?   Palpations: Abdomen is soft.  ?Musculoskeletal:     ?   General: Normal range of motion.  ?   Cervical back: Normal range of motion.  ?   Right lower leg: No edema.  ?   Left lower leg: No edema.  ?Skin: ?   General: Skin is warm and dry.  ?   Capillary Refill: Capillary refill takes less than 2 seconds.  ?   Findings: Erythema present.  ?   Comments: Hives to left upper arm with contact to BP cuff  ?Neurological:  ?   General: No focal deficit present.  ?   Mental Status: She is alert and oriented to person, place, and time.  ?Psychiatric:     ?   Mood and Affect: Mood normal.     ?   Behavior: Behavior normal.     ?   Thought Content: Thought content normal.     ?   Judgment: Judgment normal.  ? ? ?BP (!) 158/83 (BP Location: Right Arm, Patient Position: Sitting, Cuff Size: Large)   Temp 97.7 ?F (36.5 ?C)   Ht 5' 2" (1.575 m)   Wt 280 lb 9.6 oz (127.3 kg)   SpO2 100%   BMI 51.32 kg/m?  ?Wt Readings from Last 3 Encounters:   ?05/30/21 280 lb 9.6 oz (127.3 kg)  ?04/10/21 277 lb 3.2 oz (125.7 kg)  ?03/20/21 275 lb 3.2 oz (124.8 kg)  ? ? ? ?Health Maintenance Due  ?Topic Date Due  ? PAP  SMEAR-Modifier  Never done  ? COVID-19 Vaccine (2 - Booster for Janssen series) 01/12/2020  ? ? ?There are no preventive care reminders to display for this patient. ? ?Lab Results  ?Component Value Date  ? TSH 0.779 08/03/2019  ? ?Lab Results  ?Component Value Date  ? WBC 6.2 02/26/2021  ? HGB 15.3 02/26/2021  ? HCT 44.7 02/26/2021  ? MCV 89 02/26/2021  ? PLT 308 02/26/2021  ? ?Lab Results  ?Component Value Date  ? NA 138 08/03/2019  ? K 4.2 08/03/2019  ? GLUCOSE 92 08/03/2019  ? BUN 9 08/03/2019  ? CREATININE 0.89 08/03/2019  ? BILITOT 0.5 08/03/2019  ? ALKPHOS 63 08/03/2019  ? AST 13 08/03/2019  ? PROT 7.5 08/03/2019  ? ALBUMIN 4.0 08/03/2019  ? CALCIUM 9.2 08/03/2019  ? ?Lab Results  ?Component Value Date  ? CHOL 185 08/03/2019  ? ?Lab Results  ?Component Value Date  ? HDL 46 08/03/2019  ? ?Lab Results  ?Component Value Date  ? LDLCALC 122 (H) 08/03/2019  ? ?Lab Results  ?Component Value Date  ? TRIG 94 08/03/2019  ? ?Lab Results  ?Component Value Date  ? CHOLHDL 4.0 08/03/2019  ? ?Lab Results  ?Component Value Date  ? HGBA1C 6.0 (A) 10/29/2020  ? ? ?  ?Assessment & Plan:  ? ?Problem List Items Addressed This Visit   ?None ?Visit Diagnoses   ? ? Sore throat    -  Primary ?Discussed treatment options however due to upcoming allergy appointment will hold off on any treatment that can delay evaluation ?  ? Relevant Orders  ? Rapid Strep A  ? Primary hypertension     ?Amlodipine on hold concern that this maybe causing swelling. Due to her history of breathing changes and cough; concerned with starting a beta blocker or ACE. Will treat conservatively with HCTZ for now ?Encouraged on going compliance with current medication regimen ?Encouraged home monitoring and recording BP <130/80 ?Eating a heart-healthy diet with less salt ?Encouraged regular physical  activity  ?Recommend Weight loss ? ?  ? Relevant Medications  ? hydrochlorothiazide (HYDRODIURIL) 25 MG tablet  ? Suspected sleep apnea     ?Pt declines potential sleep apnea but is aware of changes would like  information

## 2021-06-06 ENCOUNTER — Ambulatory Visit (INDEPENDENT_AMBULATORY_CARE_PROVIDER_SITE_OTHER): Payer: 59 | Admitting: Allergy

## 2021-06-06 ENCOUNTER — Encounter: Payer: Self-pay | Admitting: Allergy

## 2021-06-06 VITALS — BP 148/84 | HR 68 | Temp 97.8°F | Resp 18 | Ht 62.0 in | Wt 277.5 lb

## 2021-06-06 DIAGNOSIS — T7800XA Anaphylactic reaction due to unspecified food, initial encounter: Secondary | ICD-10-CM

## 2021-06-06 DIAGNOSIS — J310 Chronic rhinitis: Secondary | ICD-10-CM | POA: Diagnosis not present

## 2021-06-06 DIAGNOSIS — T783XXD Angioneurotic edema, subsequent encounter: Secondary | ICD-10-CM

## 2021-06-06 DIAGNOSIS — L508 Other urticaria: Secondary | ICD-10-CM | POA: Diagnosis not present

## 2021-06-06 NOTE — Progress Notes (Signed)
? ? ?New Patient Note ? ?RE: Erica ReichertWanda L Ferguson MRN: [id number] DOB: March 08, 1966 ?Date of Office Visit: 06/06/2021 ? ?Primary care provider: Barbette MerinoKing, Crystal M, NP ? ?Chief Complaint: Allergic reaction ? ?History of present illness: ?Erica ReichertWanda L Ferguson is a 56 y.o. female presenting today for evaluation of allergic reaction with hives and swelling. ? ?She states episodes of swelling and hives started about 6-7 months ago.  She states at the beginning she had back to back episodes.  Right after Thanksgiving she was having episodes where she states she was not able to go to work due to swelling.  She would wake up with swelling of her face.  She states lately she has not had any episodes in past 4 weeks.  ?She reports facial and hand swelling.  She states can have swelling and hives on her hands when she brings in her groceries with plastic bags.   ?She has taken benadryl which does help but makes her sleepy.   ?Hives and swelling usually improves within the hour and does not leave behind any bruising marks.  No fevers, no joint aches/pains. Denies preceding illnesses.  No prior vaccines.  No new medications or new foods.  She does state the first time she had lip swelling she had tried a new sherbet that she had not had before; it was a generic brand.  No bites or stings.  She did change her detergent.   ? ?She had a urgent care visit on 02/26/2021 for urticaria and angioedema.  She states her lip/facial swelling was awful.  She also reports feeling like her breathing was different/off.  Review of the EMR from the urgent care visit and her PCP follow-up visit she did not have anything to eat that day yet.  There was some noted increase stress and anxiety.  She had not had any new medications but there is report that she had tried a gummy version of a skin and hair vitamin that she only took once. ?She was provided with a prescription for an EpiPen at this visit.  She also was treated with steroid injection and a prednisone  pack. ? ?Her PCP did order some labwork that is mostly food IgE as below.  Of these she eats them without issue except for shrimp.  ?She states when she would eat shrimp while eating it she would feel nauseated and lightheaded.  This started a few years ago.  She states she was eating crab without issue prior.  She does eat fish without issue.  ? ?She does report use of claritin related to pollen exposure seasonally and states it does seem to help she feels.     ? ?Review of systems in the past 4 weeks: ?Review of Systems  ?Constitutional: Negative.   ?HENT: Negative.    ?Eyes: Negative.   ?Respiratory: Negative.    ?Cardiovascular: Negative.   ?Gastrointestinal: Negative.   ?Musculoskeletal: Negative.   ?Skin: Negative.   ?Allergic/Immunologic: Negative.   ?Neurological: Negative.   ? ?All other systems negative unless noted above in HPI ? ?Past medical history: ?Past Medical History:  ?Diagnosis Date  ? Allergy   ? seasonal allergies  ? Angioedema   ? Urticaria   ? ? ?Past surgical history: ?Past Surgical History:  ?Procedure Laterality Date  ? BREAST REDUCTION SURGERY  1989  ? REDUCTION MAMMAPLASTY    ? WISDOM TOOTH EXTRACTION Bilateral 1993  ? ? ?Family history:  ?Family History  ?Problem Relation Age of Onset  ? Kidney failure  Mother   ? Hypertension Mother   ? Colon polyps Neg Hx   ? Colon cancer Neg Hx   ? Esophageal cancer Neg Hx   ? Rectal cancer Neg Hx   ? Stomach cancer Neg Hx   ? ? ?Social history: ?Lives in an apartment with carpeting with electric heating and central cooling.  No pets in the home.  No concern for water damage, mildew or roaches in the home.  She is a Arboriculturist.  Denies smoking history.   ? ? ?Medication List: ?Current Outpatient Medications  ?Medication Sig Dispense Refill  ? diphenhydrAMINE (BENADRYL) 25 mg capsule Take 25 mg by mouth every 6 (six) hours as needed.    ? EPINEPHrine 0.3 mg/0.3 mL IJ SOAJ injection For acute onset of lip swelling and airway  obstruction, inject 1 pen into thigh muscle, call 911 and note time of first injection.  Wait 5 minutes.  If symptoms have not completely resolved, give second injection in other thigh muscle and note the time of second injection. (Patient taking differently: For acute onset of lip swelling and airway obstruction, inject 1 pen into thigh muscle, call 911 and note time of first injection.  Wait 5 minutes.  If symptoms have not completely resolved, give second injection in other thigh muscle and note the time of second injection.) 2 each 5  ? hydrochlorothiazide (HYDRODIURIL) 25 MG tablet Take 1 tablet (25 mg total) by mouth daily. 90 tablet 3  ? Naproxen Sodium (ALEVE PO) Take by mouth as needed.    ? triamcinolone cream (KENALOG) 0.1 % Apply 1 application topically 2 (two) times daily. 30 g 5  ? ?No current facility-administered medications for this visit.  ? ? ?Known medication allergies: ?Allergies  ?Allergen Reactions  ? Pork Allergy Hives and Swelling  ? Shellfish-Derived Products Hives and Swelling  ? ? ? ?Physical examination: ?Blood pressure (!) 148/84, pulse 68, temperature 97.8 ?F (36.6 ?C), resp. rate 18, height 5\' 2"  (1.575 m), weight 277 lb 8 oz (125.9 kg), SpO2 95 %. ? ?General: Alert, interactive, in no acute distress. ?HEENT: PERRLA, TMs pearly gray, turbinates non-edematous without discharge, post-pharynx non erythematous. ?Neck: Supple without lymphadenopathy. ?Lungs: Clear to auscultation without wheezing, rhonchi or rales. {no increased work of breathing. ?CV: Normal S1, S2 without murmurs. ?Abdomen: Nondistended, nontender. ?Skin: Warm and dry, without lesions or rashes. ?Extremities:  No clubbing, cyanosis or edema. ?Neuro:   Grossly intact. ? ?Diagnositics/Labs: ?Labs:  ?Component ?    Latest Ref Rng 02/26/2021 03/20/2021  ?WBC ?    3.4 - 10.8 x10E3/uL 6.2    ?RBC ?    3.77 - 5.28 x10E6/uL 5.05    ?Hemoglobin ?    11.1 - 15.9 g/dL 05/18/2021    ?HCT ?    34.0 - 46.6 % 44.7    ?MCV ?    79 - 97 fL 89     ?MCH ?    26.6 - 33.0 pg 30.3    ?MCHC ?    31.5 - 35.7 g/dL 14.4    ?RDW ?    11.7 - 15.4 % 13.0    ?Platelets ?    150 - 450 x10E3/uL 308    ?Neutrophils ?    Not Estab. % 51    ?Lymphs ?    Not Estab. % 36    ?Monocytes ?    Not Estab. % 10    ?Eos ?    Not Estab. % 2    ?  Basos ?    Not Estab. % 1    ?NEUT# ?    1.4 - 7.0 x10E3/uL 3.2    ?Lymphocyte # ?    0.7 - 3.1 x10E3/uL 2.2    ?Monocytes Absolute ?    0.1 - 0.9 x10E3/uL 0.6    ?EOS (ABSOLUTE) ?    0.0 - 0.4 x10E3/uL 0.1    ?Basophils Absolute ?    0.0 - 0.2 x10E3/uL 0.0    ?Immature Granulocytes ?    Not Estab. % 0    ?Immature Grans (Abs) ?    0.0 - 0.1 x10E3/uL 0.0    ?Class Description Allergens  Comment   ?Codfish IgE ?    Class 0 kU/L  <0.10   ?F023-IgE Crab ?    Class 0/I kU/L  0.24 !   ?Shrimp IgE ?    Class 0/I kU/L  0.14 !   ?Tuna ?    Class 0 kU/L  <0.10   ?Allergen Salmon IgE ?    Class 0 kU/L  <0.10   ?F080-IgE Lobster ?    Class 0 kU/L  <0.10   ?Catfish ?    Class 0/I kU/L  0.11 !   ?Peanut IgE ?    Class 0 kU/L  <0.10   ?Hazelnut (Filbert) IgE ?    Class 0 kU/L  <0.10   ?[place] Nut IgE ?    Class 0 kU/L  <0.10   ?F020-IgE Almond ?    Class 0 kU/L  <0.10   ?Pecan Nut IgE ?    Class 0 kU/L  <0.10   ?F202-IgE Cashew Nut ?    Class 0 kU/L  <0.10   ?Walnut IgE ?    Class 0 kU/L  <0.10   ?Pork IgE ?    Class I kU/L  0.34 !   ?Beef IgE ?    Class 0 kU/L  <0.10   ?Beef IgE ?      <0.10   ?Chicken IgE ?    Class 0 kU/L  <0.10   ?Microalbumin, Urine ?    Not Estab. ug/mL  20.6   ?Milk IgE ?    Class 0 kU/L  <0.10   ?Allergen Gluten IgE ?    Class 0 kU/L  <0.10   ?  ? ?Assessment and plan: ?  ?Chronic urticaria and angioedema ?-at this time etiology of hives and swelling is most likely spontaneous in nature but does have a physical component with pressure.  Hives can be caused by a variety of different triggers including illness/infection, foods, medications, stings, exercise, pressure, vibrations, extremes of temperature to name a few however majority  of the time there is no identifiable trigger.  Your symptoms have been ongoing for >6 weeks making this chronic thus will obtain labwork to evaluate: CMP, tryptase, hive panel, environmental panel, alpha-gal pa

## 2021-06-06 NOTE — Patient Instructions (Addendum)
Chronic hives and swelling ?-at this time etiology of hives and swelling is most likely spontaneous in nature but does have a physical component with pressure.  Hives can be caused by a variety of different triggers including illness/infection, foods, medications, stings, exercise, pressure, vibrations, extremes of temperature to name a few however majority of the time there is no identifiable trigger.  Your symptoms have been ongoing for >6 weeks making this chronic thus will obtain labwork to evaluate: CMP, tryptase, hive panel, environmental panel, alpha-gal panel ?-if hives or swelling return start following antihistamine regimen: Zyrtec/Allegra/Xyzal 1 tab with Pepcid 1 tab daily.  If daily dosing is not enough then increase to twice a day dosing of both medications.  If this is not enough let us know and would add in Singulair to regimen.  If triple therapy is not enough then recommend Xolair monthly injections for chronic spontaneous hives ?-should significant symptoms recur or new symptoms occur, a journal is to be kept recording any foods eaten, beverages consumed, medications taken, activities performed, and environmental conditions within a 6 hour time period prior to the onset of symptoms.  ?-NSAIDs (like ibuprofen, Motrin, Advil, Aleve), opiate medication (like Percocet, Vicodin, hydrocodone) as well alcoholic beverages can cause histamine release and lead to swelling and hives as a side effect. ? ?  ?Food allergy ?-symptoms following shrimp ingestion with positive IgE testing indicates food allergy and would avoid shrimp ?- have access to self-injectable epinephrine (Epipen or AuviQ) 0.3mg  at all times ?- follow emergency action plan in case of allergic reaction ? ?- we have discussed the following in regards to foods: ?  Allergy: food allergy is when you have eaten a food, developed an allergic reaction after eating the food and have IgE to the food (positive food testing either by skin testing or  blood testing).  Food allergy could lead to life threatening symptoms ? Sensitivity: occurs when you have IgE to a food (positive food testing either by skin testing or blood testing) but is a food you eat without any issues.  This is not an allergy and we recommend keeping the food in the diet ? Intolerance: this is when you have negative testing by either skin testing or blood testing thus not allergic but the food causes symptoms (like belly pain, bloating, diarrhea etc) with ingestion.  These foods should be avoided to prevent symptoms.    ? ?Follow-up in 2 to 3 months or sooner if needed ?

## 2021-06-14 LAB — ALLERGENS W/TOTAL IGE AREA 2
Alternaria Alternata IgE: <0.1 kU/L
Aspergillus Fumigatus IgE: <0.1 kU/L
Bermuda Grass IgE: <0.1 kU/L
Cat Dander IgE: <0.1 kU/L
Cedar, Mountain IgE: 0.15 kU/L — AB
Cladosporium Herbarum IgE: <0.1 kU/L
Cockroach, German IgE: 0.75 kU/L — AB
Common Silver Birch IgE: <0.1 kU/L
Cottonwood IgE: <0.1 kU/L
D Farinae IgE: 43.2 kU/L — AB
D Pteronyssinus IgE: 9.47 kU/L — AB
Dog Dander IgE: <0.1 kU/L
Elm, American IgE: <0.1 kU/L
Johnson Grass IgE: <0.1 kU/L
Maple/Box Elder IgE: <0.1 kU/L
Mouse Urine IgE: <0.1 kU/L
Oak, White IgE: <0.1 kU/L
Pecan, Hickory IgE: <0.1 kU/L
Penicillium Chrysogen IgE: <0.1 kU/L
Pigweed, Rough IgE: <0.1 kU/L
Ragweed, Short IgE: <0.1 kU/L
Sheep Sorrel IgE Qn: <0.1 kU/L
Timothy Grass IgE: <0.1 kU/L
White Mulberry IgE: <0.1 kU/L

## 2021-06-14 LAB — COMPREHENSIVE METABOLIC PANEL
ALT: 15 IU/L (ref 0–32)
AST: 18 IU/L (ref 0–40)
Albumin/Globulin Ratio: 1.4 (ref 1.2–2.2)
Albumin: 4.2 g/dL (ref 3.8–4.9)
Alkaline Phosphatase: 73 IU/L (ref 44–121)
BUN/Creatinine Ratio: 13 (ref 9–23)
BUN: 13 mg/dL (ref 6–24)
Bilirubin Total: 0.5 mg/dL (ref 0.0–1.2)
CO2: 28 mmol/L (ref 20–29)
Calcium: 10 mg/dL (ref 8.7–10.2)
Chloride: 100 mmol/L (ref 96–106)
Creatinine, Ser: 1.01 mg/dL — ABNORMAL HIGH (ref 0.57–1.00)
Globulin, Total: 3.1 g/dL (ref 1.5–4.5)
Glucose: 131 mg/dL — ABNORMAL HIGH (ref 70–99)
Potassium: 4.2 mmol/L (ref 3.5–5.2)
Sodium: 140 mmol/L (ref 134–144)
Total Protein: 7.3 g/dL (ref 6.0–8.5)
eGFR: 65 mL/min/{1.73_m2} (ref >59)

## 2021-06-14 LAB — ALPHA-GAL PANEL
Allergen Lamb IgE: <0.1 kU/L
Beef IgE: <0.1 kU/L
IgE (Immunoglobulin E), Serum: 428 IU/mL (ref 6–495)
O215-IgE Alpha-Gal: <0.1 kU/L
Pork IgE: 0.57 kU/L — AB

## 2021-06-14 LAB — CHRONIC URTICARIA: cu index: 3.2 (ref <10)

## 2021-06-14 LAB — THYROID ANTIBODIES
Thyroglobulin Antibody: <1 IU/mL (ref 0.0–0.9)
Thyroperoxidase Ab SerPl-aCnc: 16 IU/mL (ref 0–34)

## 2021-06-14 LAB — TRYPTASE: Tryptase: 9.7 ug/L (ref 2.2–13.2)

## 2021-06-16 LAB — POCT RAPID STREP A (OFFICE): Rapid Strep A Screen: NEGATIVE

## 2021-06-28 ENCOUNTER — Telehealth: Payer: Self-pay | Admitting: Nurse Practitioner

## 2021-06-28 DIAGNOSIS — R29818 Other symptoms and signs involving the nervous system: Secondary | ICD-10-CM

## 2021-06-28 DIAGNOSIS — G4733 Obstructive sleep apnea (adult) (pediatric): Secondary | ICD-10-CM | POA: Insufficient documentation

## 2021-06-28 NOTE — Telephone Encounter (Signed)
?  06/28/2021 ?Name: [name] MRN: [id number] DOB: 08/13/1965 ? ?[name] is a 56 y.o. year old female who has suspected sleep apnea. Has agreed to have sleep study completed. She endorses snoring and changes in sleep pattern.  ? ?1. Suspected sleep apnea ?- Ambulatory referral to Sleep Studies ?  ?Follow up;  ?Referral to neurology placed previously; for Inspire based on sleep study results.  ? ? ?

## 2021-07-01 ENCOUNTER — Ambulatory Visit (INDEPENDENT_AMBULATORY_CARE_PROVIDER_SITE_OTHER): Payer: 59 | Admitting: Nurse Practitioner

## 2021-07-01 ENCOUNTER — Encounter: Payer: Self-pay | Admitting: Nurse Practitioner

## 2021-07-01 VITALS — BP 150/77 | HR 64 | Temp 98.2°F | Ht 62.0 in | Wt 282.2 lb

## 2021-07-01 DIAGNOSIS — Z131 Encounter for screening for diabetes mellitus: Secondary | ICD-10-CM | POA: Diagnosis not present

## 2021-07-01 DIAGNOSIS — I10 Essential (primary) hypertension: Secondary | ICD-10-CM

## 2021-07-01 LAB — POCT GLYCOSYLATED HEMOGLOBIN (HGB A1C)
HbA1c POC (<> result, manual entry): 7.2 % (ref 4.0–5.6)
HbA1c, POC (controlled diabetic range): 7.2 % — AB (ref 0.0–7.0)
HbA1c, POC (prediabetic range): 7.2 % — AB (ref 5.7–6.4)
Hemoglobin A1C: 7.2 % — AB (ref 4.0–5.6)

## 2021-07-01 NOTE — Progress Notes (Signed)
@Patient  ID: , female    DOB: 06/20/1965, 56 y.o.   MRN: [id number] ? ?Chief Complaint  ?Patient presents with  ? Follow-up  ?  Patient is here today for her follow up for Hypertension.  ? ? ?Referring provider: ?[id number], NP ? ? ?HPI ? ?Patient presents today for follow-up visit for hypertension.  Patient states that she is compliant with her medications.  She states that she has been having a stressful day at work today.  Her blood pressure is elevated in office today.  Upon arrival to the office her blood pressure was 163/77 and after sitting for few minutes her blood pressure did come down to 150/77.  Patient is due for blood work today.  Patient's hemoglobin A1c was checked in office today and did come back at 7.2.  We discussed low-salt and diabetic diet and exercise.  Patient is requesting a trial of 3 months of lifestyle adjustments before being placed on medications for diabetes. Denies f/c/s, n/v/d, hemoptysis, PND, chest pain or edema. ? ? ? ? ? ? ? ? ? ?Allergies  ?Allergen Reactions  ? Pork Allergy Hives and Swelling  ? ? ?Immunization History  ?Administered Date(s) Administered  ? Janssen (J&J) SARS-COV-2 Vaccination 11/17/2019  ? Tdap 02/24/2018  ? ? ?Past Medical History:  ?Diagnosis Date  ? Allergy   ? seasonal allergies  ? Angioedema   ? Urticaria   ? ? ?Tobacco History: ?Social History  ? ?Tobacco Use  ?Smoking Status Never  ?Smokeless Tobacco Never  ? ?Counseling given: Not Answered ? ? ?Outpatient Encounter Medications as of 07/01/2021  ?Medication Sig  ? EPINEPHrine 0.3 mg/0.3 mL IJ SOAJ injection For acute onset of lip swelling and airway obstruction, inject 1 pen into thigh muscle, call 911 and note time of first injection.  Wait 5 minutes.  If symptoms have not completely resolved, give second injection in other thigh muscle and note the time of second injection. (Patient taking differently: For acute onset of lip swelling and airway obstruction, inject 1 pen into thigh  muscle, call 911 and note time of first injection.  Wait 5 minutes.  If symptoms have not completely resolved, give second injection in other thigh muscle and note the time of second injection.)  ? hydrochlorothiazide (HYDRODIURIL) 25 MG tablet Take 1 tablet (25 mg total) by mouth daily.  ? Naproxen Sodium (ALEVE PO) Take by mouth as needed.  ? triamcinolone cream (KENALOG) 0.1 % Apply 1 application topically 2 (two) times daily.  ? diphenhydrAMINE (BENADRYL) 25 mg capsule Take 25 mg by mouth every 6 (six) hours as needed. (Patient not taking: Reported on 07/01/2021)  ? ?No facility-administered encounter medications on file as of 07/01/2021.  ? ? ? ?Review of Systems ? ?Review of Systems  ?Constitutional: Negative.   ?HENT: Negative.    ?Cardiovascular: Negative.   ?Gastrointestinal: Negative.   ?Allergic/Immunologic: Negative.   ?Neurological: Negative.   ?Psychiatric/Behavioral: Negative.     ? ? ? ?Physical Exam ? ?BP (!) 150/77   Pulse 64   Temp 98.2 ?F (36.8 ?C)   Ht 5\' 2"  (1.575 m)   Wt 282 lb 3.2 oz (128 kg)   SpO2 99%   BMI 51.62 kg/m?  ? ?Wt Readings from Last 5 Encounters:  ?07/01/21 282 lb 3.2 oz (128 kg)  ?06/06/21 277 lb 8 oz (125.9 kg)  ?05/30/21 280 lb 9.6 oz (127.3 kg)  ?04/10/21 277 lb 3.2 oz (125.7 kg)  ?03/20/21 275 lb 3.2 oz (  124.8 kg)  ? ? ? ?Physical Exam ?Vitals and nursing note reviewed.  ?Constitutional:   ?   General: She is not in acute distress. ?   Appearance: She is well-developed.  ?Cardiovascular:  ?   Rate and Rhythm: Normal rate and regular rhythm.  ?Pulmonary:  ?   Effort: Pulmonary effort is normal.  ?   Breath sounds: Normal breath sounds.  ?Neurological:  ?   Mental Status: She is alert and oriented to person, place, and time.  ? ? ? ?Lab Results: ? ?CBC ?   ?Component Value Date/Time  ? WBC 7.4 07/02/2021 0911  ? RBC 4.96 07/02/2021 0911  ? HGB 15.0 07/02/2021 0911  ? HCT 45.5 07/02/2021 0911  ? PLT 285 07/02/2021 0911  ? MCV 92 07/02/2021 0911  ? MCH 30.2 07/02/2021 0911   ? MCHC 33.0 07/02/2021 0911  ? RDW 13.3 07/02/2021 0911  ? LYMPHSABS 2.2 02/26/2021 1031  ? EOSABS 0.1 02/26/2021 1031  ? BASOSABS 0.0 02/26/2021 1031  ? ? ?BMET ?   ?Component Value Date/Time  ? NA 139 07/02/2021 0911  ? K 4.1 07/02/2021 0911  ? CL 99 07/02/2021 0911  ? CO2 24 07/02/2021 0911  ? GLUCOSE 118 (H) 07/02/2021 0911  ? BUN 9 07/02/2021 0911  ? CREATININE 1.01 (H) 07/02/2021 0911  ? CALCIUM 9.2 07/02/2021 0911  ? GFRNONAA 73 08/03/2019 1412  ? GFRAA 84 08/03/2019 1412  ? ? ?BNP ?No results found for: BNP ? ?ProBNP ?No results found for: PROBNP ? ?Imaging: ?No results found. ? ? ?Assessment & Plan:  ? ?Primary hypertension ?- CBC ?- Comprehensive metabolic panel ?- Lipid Panel ?- continue current dose of HCTZ ?-keep blood pressure log for next visit.  ? ?2. Diabetes mellitus screening ?Lab Results  ?Component Value Date  ? HGBA1C 7.2 (A) 07/01/2021  ? HGBA1C 7.2 07/01/2021  ? HGBA1C 7.2 (A) 07/01/2021  ? HGBA1C 7.2 (A) 07/01/2021  ? ? ? ?- HgB A1c ? ?-Lifestyle adjustments ? ? ?Follow up: ? ?Follow up in 2 weeks for blood pressure - and 3 months for diabetes check ? ? ?Patient Instructions  ?1. Primary hypertension ? ?- CBC ?- Comprehensive metabolic panel ?- Lipid Panel ?- continue current dose of HCTZ ?-keep blood pressure log for next visit.  ? ?2. Diabetes mellitus screening ?Lab Results  ?Component Value Date  ? HGBA1C 7.2 (A) 07/01/2021  ? HGBA1C 7.2 07/01/2021  ? HGBA1C 7.2 (A) 07/01/2021  ? HGBA1C 7.2 (A) 07/01/2021  ? ? ? ?- HgB A1c ? ?-Lifestyle adjustments ? ? ?Follow up: ? ?Follow up in 2 weeks for blood pressure - and 3 months for diabetes check ? ?Prediabetes Eating Plan ?Prediabetes is a condition that causes blood sugar (glucose) levels to be higher than normal. This increases the risk for developing type 2 diabetes (type 2 diabetes mellitus). Working with a health care provider or nutrition specialist (dietitian) to make diet and lifestyle changes can help prevent the onset of diabetes.  These changes may help you: ?Control your blood glucose levels. ?Improve your cholesterol levels. ?Manage your blood pressure. ?What are tips for following this plan? ?Reading food labels ?Read food labels to check the amount of fat, salt (sodium), and sugar in prepackaged foods. Avoid foods that have: ?Saturated fats. ?Trans fats. ?Added sugars. ?Avoid foods that have more than 300 milligrams (mg) of sodium per serving. Limit your sodium intake to less than 2,300 mg each day. ?Shopping ?Avoid buying pre-made and processed foods. ?Avoid  buying drinks with added sugar. ?Cooking ?Cook with olive oil. Do not use butter, lard, or ghee. ?Bake, broil, grill, steam, or boil foods. Avoid frying. ?Meal planning ? ?Work with your dietitian to create an eating plan that is right for you. This may include tracking how many calories you take in each day. Use a food diary, notebook, or mobile application to track what you eat at each meal. ?Consider following a Mediterranean diet. This includes: ?Eating several servings of fresh fruits and vegetables each day. ?Eating fish at least twice a week. ?Eating one serving each day of whole grains, beans, nuts, and seeds. ?Using olive oil instead of other fats. ?Limiting alcohol. ?Limiting red meat. ?Using nonfat or low-fat dairy products. ?Consider following a plant-based diet. This includes dietary choices that focus on eating mostly vegetables and fruit, grains, beans, nuts, and seeds. ?If you have high blood pressure, you may need to limit your sodium intake or follow a diet such as the DASH (Dietary Approaches to Stop Hypertension) eating plan. The DASH diet aims to lower high blood pressure. ?Lifestyle ?Set weight loss goals with help from your health care team. It is recommended that most people with prediabetes lose 7% of their body weight. ?Exercise for at least 30 minutes 5 or more days a week. ?Attend a support group or seek support from a mental health counselor. ?Take  over-the-counter and prescription medicines only as told by your health care provider. ?What foods are recommended? ?Fruits ?Berries. Bananas. Apples. Oranges. Grapes. Papaya. Mango. Pomegranate. Kiwi. Grapefruit. Ch

## 2021-07-01 NOTE — Patient Instructions (Addendum)
1. Primary hypertension ? ?- CBC ?- Comprehensive metabolic panel ?- Lipid Panel ?- continue current dose of HCTZ ?-keep blood pressure log for next visit.  ? ?2. Diabetes mellitus screening ?Lab Results  ?Component Value Date  ? HGBA1C 7.2 (A) 07/01/2021  ? HGBA1C 7.2 07/01/2021  ? HGBA1C 7.2 (A) 07/01/2021  ? HGBA1C 7.2 (A) 07/01/2021  ? ? ? ?- HgB A1c ? ?-Lifestyle adjustments ? ? ?Follow up: ? ?Follow up in 2 weeks for blood pressure - and 3 months for diabetes check ? ?Prediabetes Eating Plan ?Prediabetes is a condition that causes blood sugar (glucose) levels to be higher than normal. This increases the risk for developing type 2 diabetes (type 2 diabetes mellitus). Working with a health care provider or nutrition specialist (dietitian) to make diet and lifestyle changes can help prevent the onset of diabetes. These changes may help you: ?Control your blood glucose levels. ?Improve your cholesterol levels. ?Manage your blood pressure. ?What are tips for following this plan? ?Reading food labels ?Read food labels to check the amount of fat, salt (sodium), and sugar in prepackaged foods. Avoid foods that have: ?Saturated fats. ?Trans fats. ?Added sugars. ?Avoid foods that have more than 300 milligrams (mg) of sodium per serving. Limit your sodium intake to less than 2,300 mg each day. ?Shopping ?Avoid buying pre-made and processed foods. ?Avoid buying drinks with added sugar. ?Cooking ?Cook with olive oil. Do not use butter, lard, or ghee. ?Bake, broil, grill, steam, or boil foods. Avoid frying. ?Meal planning ? ?Work with your dietitian to create an eating plan that is right for you. This may include tracking how many calories you take in each day. Use a food diary, notebook, or mobile application to track what you eat at each meal. ?Consider following a Mediterranean diet. This includes: ?Eating several servings of fresh fruits and vegetables each day. ?Eating fish at least twice a week. ?Eating one serving  each day of whole grains, beans, nuts, and seeds. ?Using olive oil instead of other fats. ?Limiting alcohol. ?Limiting red meat. ?Using nonfat or low-fat dairy products. ?Consider following a plant-based diet. This includes dietary choices that focus on eating mostly vegetables and fruit, grains, beans, nuts, and seeds. ?If you have high blood pressure, you may need to limit your sodium intake or follow a diet such as the DASH (Dietary Approaches to Stop Hypertension) eating plan. The DASH diet aims to lower high blood pressure. ?Lifestyle ?Set weight loss goals with help from your health care team. It is recommended that most people with prediabetes lose 7% of their body weight. ?Exercise for at least 30 minutes 5 or more days a week. ?Attend a support group or seek support from a mental health counselor. ?Take over-the-counter and prescription medicines only as told by your health care provider. ?What foods are recommended? ?Fruits ?Berries. Bananas. Apples. Oranges. Grapes. Papaya. Mango. Pomegranate. Kiwi. Grapefruit. Cherries. ?Vegetables ?Lettuce. Spinach. Peas. Beets. Cauliflower. Cabbage. Broccoli. Carrots. Tomatoes. Squash. Eggplant. Herbs. Peppers. Onions. Cucumbers. Brussels sprouts. ?Grains ?Whole grains, such as whole-wheat or whole-grain breads, crackers, cereals, and pasta. Unsweetened oatmeal. Bulgur. Barley. Quinoa. Brown rice. Corn or whole-wheat flour tortillas or taco shells. ?Meats and other proteins ?Seafood. Poultry without skin. Lean cuts of pork and beef. Tofu. Eggs. Nuts. Beans. ?Dairy ?Low-fat or fat-free dairy products, such as yogurt, cottage cheese, and cheese. ?Beverages ?Water. Tea. Coffee. Sugar-free or diet soda. Seltzer water. Low-fat or nonfat milk. Milk alternatives, such as soy or almond milk. ?Fats and oils ?Olive  oil. Canola oil. Sunflower oil. Grapeseed oil. Avocado. Walnuts. ?Sweets and desserts ?Sugar-free or low-fat pudding. Sugar-free or low-fat ice cream and other frozen  treats. ?Seasonings and condiments ?Herbs. Sodium-free spices. Mustard. Relish. Low-salt, low-sugar ketchup. Low-salt, low-sugar barbecue sauce. Low-fat or fat-free mayonnaise. ?The items listed above may not be a complete list of recommended foods and beverages. Contact a dietitian for more information. ?What foods are not recommended? ?Fruits ?Fruits canned with syrup. ?Vegetables ?Canned vegetables. Frozen vegetables with butter or cream sauce. ?Grains ?Refined white flour and flour products, such as bread, pasta, snack foods, and cereals. ?Meats and other proteins ?Fatty cuts of meat. Poultry with skin. Breaded or fried meat. Processed meats. ?Dairy ?Full-fat yogurt, cheese, or milk. ?Beverages ?Sweetened drinks, such as iced tea and soda. ?Fats and oils ?Butter. Lard. Ghee. ?Sweets and desserts ?Baked goods, such as cake, cupcakes, pastries, cookies, and cheesecake. ?Seasonings and condiments ?Spice mixes with added salt. Ketchup. Barbecue sauce. Mayonnaise. ?The items listed above may not be a complete list of foods and beverages that are not recommended. Contact a dietitian for more information. ?Where to find more information ?American Diabetes Association: www.diabetes.org ?Summary ?You may need to make diet and lifestyle changes to help prevent the onset of diabetes. These changes can help you control blood sugar, improve cholesterol levels, and manage blood pressure. ?Set weight loss goals with help from your health care team. It is recommended that most people with prediabetes lose 7% of their body weight. ?Consider following a Mediterranean diet. This includes eating plenty of fresh fruits and vegetables, whole grains, beans, nuts, seeds, fish, and low-fat dairy, and using olive oil instead of other fats. ?This information is not intended to replace advice given to you by your health care provider. Make sure you discuss any questions you have with your health care provider. ?Document Revised: 05/26/2019  Document Reviewed: 05/26/2019 ?Elsevier Patient Education ? 2023 Elsevier Inc. ? ? ?How to Take Your Blood Pressure ?Blood pressure measures how strongly your blood is pressing against the walls of your arteries. Arteries are blood vessels that carry blood from your heart throughout your body. You can take your blood pressure at home with a machine. ?You may need to check your blood pressure at home: ?To check if you have high blood pressure (hypertension). ?To check your blood pressure over time. ?To make sure your blood pressure medicine is working. ?Supplies needed: ?Blood pressure machine, or monitor. ?A chair to sit in. This should be a chair where you can sit upright with your back supported. Do not sit on a soft couch or an armchair. ?Table or desk. ?Small notebook. ?Pencil or pen. ?How to prepare ?Avoid these things for 30 minutes before checking your blood pressure: ?Having drinks with caffeine in them, such as coffee or tea. ?Drinking alcohol. ?Eating. ?Smoking. ?Exercising. ?Do these things five minutes before checking your blood pressure: ?Go to the bathroom and pee (urinate). ?Sit in a chair. ?Be quiet. Do not talk. ?How to take your blood pressure ?Follow the instructions that came with your machine. If you have a digital blood pressure monitor, these may be the instructions: ?Sit up straight. ?Place your feet on the floor. Do not cross your ankles or legs. ?Rest your left arm at the level of your heart. You may rest it on a table, desk, or chair. ?Pull up your shirt sleeve. ?Wrap the blood pressure cuff around the upper part of your left arm. The cuff should be 1 inch (2.5 cm) above  your elbow. It is best to wrap the cuff around bare skin. ?Fit the cuff snugly around your arm, but not too tightly. You should be able to place only one finger between the cuff and your arm. ?Place the cord so that it rests in the bend of your elbow. ?Press the power button. ?Sit quietly while the cuff fills with air and  loses air. ?Write down the numbers on the screen. ?Wait 2-3 minutes and then repeat steps 1-10. ?What do the numbers mean? ?Two numbers make up your blood pressure. The first number is called systolic

## 2021-07-02 DIAGNOSIS — I10 Essential (primary) hypertension: Secondary | ICD-10-CM | POA: Diagnosis not present

## 2021-07-03 ENCOUNTER — Encounter: Payer: Self-pay | Admitting: Nurse Practitioner

## 2021-07-03 DIAGNOSIS — I10 Essential (primary) hypertension: Secondary | ICD-10-CM | POA: Insufficient documentation

## 2021-07-03 LAB — COMPREHENSIVE METABOLIC PANEL
ALT: 15 IU/L (ref 0–32)
AST: 19 IU/L (ref 0–40)
Albumin/Globulin Ratio: 1.1 — ABNORMAL LOW (ref 1.2–2.2)
Albumin: 3.9 g/dL (ref 3.8–4.9)
Alkaline Phosphatase: 69 IU/L (ref 44–121)
BUN/Creatinine Ratio: 9 (ref 9–23)
BUN: 9 mg/dL (ref 6–24)
Bilirubin Total: 0.4 mg/dL (ref 0.0–1.2)
CO2: 24 mmol/L (ref 20–29)
Calcium: 9.2 mg/dL (ref 8.7–10.2)
Chloride: 99 mmol/L (ref 96–106)
Creatinine, Ser: 1.01 mg/dL — ABNORMAL HIGH (ref 0.57–1.00)
Globulin, Total: 3.5 g/dL (ref 1.5–4.5)
Glucose: 118 mg/dL — ABNORMAL HIGH (ref 70–99)
Potassium: 4.1 mmol/L (ref 3.5–5.2)
Sodium: 139 mmol/L (ref 134–144)
Total Protein: 7.4 g/dL (ref 6.0–8.5)
eGFR: 65 mL/min/{1.73_m2} (ref >59)

## 2021-07-03 LAB — CBC
Hematocrit: 45.5 % (ref 34.0–46.6)
Hemoglobin: 15 g/dL (ref 11.1–15.9)
MCH: 30.2 pg (ref 26.6–33.0)
MCHC: 33 g/dL (ref 31.5–35.7)
MCV: 92 fL (ref 79–97)
Platelets: 285 10*3/uL (ref 150–450)
RBC: 4.96 x10E6/uL (ref 3.77–5.28)
RDW: 13.3 % (ref 11.7–15.4)
WBC: 7.4 10*3/uL (ref 3.4–10.8)

## 2021-07-03 LAB — LIPID PANEL
Chol/HDL Ratio: 4.2 ratio (ref 0.0–4.4)
Cholesterol, Total: 191 mg/dL (ref 100–199)
HDL: 46 mg/dL (ref >39)
LDL Chol Calc (NIH): 124 mg/dL — ABNORMAL HIGH (ref 0–99)
Triglycerides: 117 mg/dL (ref 0–149)
VLDL Cholesterol Cal: 21 mg/dL (ref 5–40)

## 2021-07-03 NOTE — Assessment & Plan Note (Signed)
-   CBC ?- Comprehensive metabolic panel ?- Lipid Panel ?- continue current dose of HCTZ ?-keep blood pressure log for next visit.  ? ?2. Diabetes mellitus screening ?Lab Results  ?Component Value Date  ? HGBA1C 7.2 (A) 07/01/2021  ? HGBA1C 7.2 07/01/2021  ? HGBA1C 7.2 (A) 07/01/2021  ? HGBA1C 7.2 (A) 07/01/2021  ? ? ? ?- HgB A1c ? ?-Lifestyle adjustments ? ? ?Follow up: ? ?Follow up in 2 weeks for blood pressure - and 3 months for diabetes check ?

## 2021-07-15 ENCOUNTER — Ambulatory Visit: Payer: 59

## 2021-09-14 ENCOUNTER — Encounter: Payer: Self-pay | Admitting: Emergency Medicine

## 2021-09-14 ENCOUNTER — Ambulatory Visit (INDEPENDENT_AMBULATORY_CARE_PROVIDER_SITE_OTHER): Payer: 59

## 2021-09-14 ENCOUNTER — Ambulatory Visit
Admission: EM | Admit: 2021-09-14 | Discharge: 2021-09-14 | Disposition: A | Discharge status: Home / Self Care | Payer: 59 | Attending: Urgent Care | Admitting: Urgent Care

## 2021-09-14 DIAGNOSIS — M2550 Pain in unspecified joint: Secondary | ICD-10-CM

## 2021-09-14 DIAGNOSIS — M25571 Pain in right ankle and joints of right foot: Secondary | ICD-10-CM | POA: Diagnosis not present

## 2021-09-14 DIAGNOSIS — M79671 Pain in right foot: Secondary | ICD-10-CM

## 2021-09-14 DIAGNOSIS — W19XXXA Unspecified fall, initial encounter: Secondary | ICD-10-CM

## 2021-09-14 DIAGNOSIS — M25532 Pain in left wrist: Secondary | ICD-10-CM | POA: Diagnosis not present

## 2021-09-14 MED ORDER — PREDNISONE 10 MG PO TABS
30.0000 mg | ORAL_TABLET | Freq: Every day | ORAL | 0 refills | Status: DC
Start: 2021-09-14 — End: 2021-12-10

## 2021-09-14 NOTE — ED Triage Notes (Signed)
Pt here after a fall yesterday onto concrete and c/o of entire left arm pain, left shin pain, right heel and ankle pan and right wrist pain.

## 2021-09-14 NOTE — ED Provider Notes (Signed)
[facility]   MRN: [id number] DOB: June 10, 1965  Subjective:   Erica Ferguson is a 56 y.o. female presenting for multiple joint pains.  Patient fell accidentally towards her left side and twisted her right ankle/foot.  She has had multiple joint pains including in the left arm, left wrist, left hand, left shin, right ankle and foot. Her right heel hurts the worst. Reports that she can barely walk from pain while trying to bear weight. Has used icing. No head injury, loss of consciousness, confusion, headache. Patient's allergist asked her to not take any naproxen but she reports that she does have a prescription for meloxicam.  Has not used this.  Does not want any narcotic pain medicines.  No current facility-administered medications for this encounter.  Current Outpatient Medications:    diphenhydrAMINE (BENADRYL) 25 mg capsule, Take 25 mg by mouth every 6 (six) hours as needed. (Patient not taking: Reported on 07/01/2021), Disp: , Rfl:    EPINEPHrine 0.3 mg/0.3 mL IJ SOAJ injection, For acute onset of lip swelling and airway obstruction, inject 1 pen into thigh muscle, call 911 and note time of first injection.  Wait 5 minutes.  If symptoms have not completely resolved, give second injection in other thigh muscle and note the time of second injection. (Patient taking differently: For acute onset of lip swelling and airway obstruction, inject 1 pen into thigh muscle, call 911 and note time of first injection.  Wait 5 minutes.  If symptoms have not completely resolved, give second injection in other thigh muscle and note the time of second injection.), Disp: 2 each, Rfl: 5   hydrochlorothiazide (HYDRODIURIL) 25 MG tablet, Take 1 tablet (25 mg total) by mouth daily., Disp: 90 tablet, Rfl: 3   Naproxen Sodium (ALEVE PO), Take by mouth as needed., Disp: , Rfl:    triamcinolone cream (KENALOG) 0.1 %, Apply 1 application topically 2 (two) times daily., Disp: 30 g, Rfl: 5    Allergies  Allergen Reactions   Pork Allergy Hives and Swelling    Past Medical History:  Diagnosis Date   Allergy    seasonal allergies   Angioedema    Urticaria      Past Surgical History:  Procedure Laterality Date   BREAST REDUCTION SURGERY  1989   REDUCTION MAMMAPLASTY     WISDOM TOOTH EXTRACTION Bilateral 1993    Family History  Problem Relation Age of Onset   Kidney failure Mother    Hypertension Mother    Colon polyps Neg Hx    Colon cancer Neg Hx    Esophageal cancer Neg Hx    Rectal cancer Neg Hx    Stomach cancer Neg Hx     Social History   Tobacco Use   Smoking status: Never   Smokeless tobacco: Never  Vaping Use   Vaping Use: Never used  Substance Use Topics   Alcohol use: Not Currently   Drug use: Never    ROS   Objective:   Vitals: BP (!) 160/90   Pulse 61   Temp 98.1 F (36.7 C)   Resp 20   SpO2 93%   Physical Exam Constitutional:      General: She is not in acute distress.    Appearance: Normal appearance. She is well-developed. She is obese. She is not ill-appearing, toxic-appearing or diaphoretic.  HENT:     Head: Normocephalic and atraumatic.     Nose: Nose normal.     Mouth/Throat:  Mouth: Mucous membranes are moist.  Eyes:     General: No scleral icterus.       Right eye: No discharge.        Left eye: No discharge.     Extraocular Movements: Extraocular movements intact.  Neck:     Meningeal: Brudzinski's sign and Kernig's sign absent.  Cardiovascular:     Rate and Rhythm: Normal rate.  Pulmonary:     Effort: Pulmonary effort is normal.  Musculoskeletal:     Comments: Multiple joint pains worse over the wrist of the left side.  Also has decreased range of motion of the right ankle.  Pain throughout extending into the proximal foot, heel.  No ecchymosis, swelling, bony deformity, bruising.  Near full range of motion throughout for the upper and lower extremities.  Skin:    General: Skin is warm and dry.   Neurological:     General: No focal deficit present.     Mental Status: She is alert and oriented to person, place, and time.     Cranial Nerves: No cranial nerve deficit, dysarthria or facial asymmetry.     Motor: No weakness or pronator drift.     Coordination: Romberg sign negative. Coordination normal. Finger-Nose-Finger Test and Heel to [facility] Test normal. Rapid alternating movements normal.     Gait: Gait and tandem walk normal.     Deep Tendon Reflexes: Reflexes normal.  Psychiatric:        Mood and Affect: Mood normal.        Behavior: Behavior normal.        Thought Content: Thought content normal.        Judgment: Judgment normal.    DG Wrist Complete Left  Result Date: 09/14/2021 CLINICAL DATA:  Left wrist pain after fall yesterday. EXAM: LEFT WRIST - COMPLETE 3+ VIEW COMPARISON:  None Available. FINDINGS: There is no evidence of fracture or dislocation. There is no evidence of arthropathy or other focal bone abnormality. Soft tissues are unremarkable. IMPRESSION: Negative. Electronically Signed   By: Obie Dredge M.D.   On: 09/14/2021 10:05    Assessment and Plan :   PDMP not reviewed this encounter.  1. Multiple joint pain   2. Left wrist pain   3. Right foot pain   4. Acute right ankle pain   5. Accidental fall, initial encounter    As patient has concerns related to NSAIDs due to what her allergist told her to avoid I offered her an oral prednisone course.  She declined any narcotic pain medicines.  I reviewed her imaging results including the suspicious possible osteochondral lesion.  Recommended further work-up with an orthopedist and provided her with information for this. Counseled patient on potential for adverse effects with medications prescribed/recommended today, ER and return-to-clinic precautions discussed, patient verbalized understanding.    Wallis Bamberg, PA-C 09/14/21 1020

## 2021-09-23 ENCOUNTER — Telehealth: Payer: 59 | Admitting: Physician Assistant

## 2021-09-23 ENCOUNTER — Other Ambulatory Visit (HOSPITAL_COMMUNITY): Payer: Self-pay

## 2021-09-23 DIAGNOSIS — R3989 Other symptoms and signs involving the genitourinary system: Secondary | ICD-10-CM | POA: Diagnosis not present

## 2021-09-23 MED ORDER — CEPHALEXIN 500 MG PO CAPS
500.0000 mg | ORAL_CAPSULE | Freq: Two times a day (BID) | ORAL | 0 refills | Status: AC
  Filled 2021-09-23: qty 14, 7d supply, fill #0

## 2021-09-23 NOTE — Progress Notes (Signed)

## 2021-09-23 NOTE — Progress Notes (Signed)
I have spent 5 minutes in review of e-visit questionnaire, review and updating patient chart, medical decision making and response to patient.   Amaree Leeper Cody Cina Klumpp, PA-C    

## 2021-09-25 ENCOUNTER — Ambulatory Visit: Payer: 59 | Admitting: Family Medicine

## 2021-09-30 ENCOUNTER — Ambulatory Visit: Payer: 59 | Admitting: Nurse Practitioner

## 2021-10-01 ENCOUNTER — Encounter: Payer: Self-pay | Admitting: Sports Medicine

## 2021-10-01 ENCOUNTER — Ambulatory Visit (INDEPENDENT_AMBULATORY_CARE_PROVIDER_SITE_OTHER): Payer: 59 | Admitting: Sports Medicine

## 2021-10-01 ENCOUNTER — Other Ambulatory Visit (HOSPITAL_COMMUNITY): Payer: Self-pay

## 2021-10-01 VITALS — BP 136/72 | Ht 62.0 in | Wt 267.0 lb

## 2021-10-01 DIAGNOSIS — M25562 Pain in left knee: Secondary | ICD-10-CM | POA: Diagnosis not present

## 2021-10-01 DIAGNOSIS — M25571 Pain in right ankle and joints of right foot: Secondary | ICD-10-CM | POA: Diagnosis not present

## 2021-10-01 MED ORDER — DICLOFENAC SODIUM 1 % EX GEL
4.0000 g | Freq: Four times a day (QID) | CUTANEOUS | 0 refills | Status: DC
  Filled 2021-10-01: qty 100, 6d supply, fill #0

## 2021-10-01 NOTE — Patient Instructions (Signed)
Great seeing you today!  We have prescribed Voltaren gel to use on the areas of pain as needed.  We will get an x-ray of your left knee.  You can ice as needed for the pain  We are referring you for physical therapy for your right ankle.  Return in 3 weeks for follow-up.

## 2021-10-01 NOTE — Progress Notes (Unsigned)
v   SUBJECTIVE:   CHIEF COMPLAINT / HPI:   Patient presents for evaluation of joint pain. Was seen at urgent care on 7/8 after she fell on outstretched hands and believes she may have twisted her right ankle  Patient had concerns about NSAIDs, and declined narcotic pain medication, so was given an oral prednisone course.  Was advised to follow-up due to suspicious possible osteochondral lesion of right ankle  Today she endorses continued pain and also with left subpatellar pain. No complaints of catching or locking. No prior history of ankle, knee or wrist pain. Steroid course helped for her joint pain initially but pain still persists. Pain worse with movement. Has tried Tylenol at night which helps her sleep and not think about it. Did try some ice right after the fall.    OBJECTIVE:   BP 136/72 (BP Location: Left Arm, Patient Position: Sitting, Cuff Size: Normal)   Ht 5\' 2"  (1.575 m)   Wt 267 lb (121.1 kg)   BMI 48.83 kg/m    Physical exam General: well appearing, NAD Cardiovascular: RRR, no murmurs Lungs: CTAB. Normal WOB Abdomen: soft, non-distended, non-tender Skin: warm, dry. No edema  R Foot: Inspection:  No obvious bony deformity.  No swelling, erythema, or bruising Palpation: Mild TTP under lateral malleolus  ROM: Full  ROM of the ankle Strength: 5/5 strength ankle in all planes  Neurovascular: N/V intact distally in the lower extremity Special tests: Negative anterior drawer. Negative squeeze.  L Knee: - Inspection: no gross deformity. No swelling/effusion, erythema or bruising of note though exam limited 2/2 body habitus  - Palpation: TTP infrapatellar region  - ROM: full active ROM with flexion and extension in knee and hip b/l - Strength: 5/5 strength b/l - Neuro/vasc: NV intact distally b/l - Special Tests: negative anterior and posterior drawer, negative Lachman's, no MCL or LCL laxity. negative McMurray's. nml patellar mobility bilaterally   R & L  Hand: Inspection: No obvious deformity b/l. No swelling, erythema or bruising b/l Palpation: mild TTP b/l at thenar eminence  ROM: Full ROM of the digits and wrist b/l. Fully able to extend and flex finger. Strength: 5/5 strength Neurovascular: NV intact b/l Special tests:  negative tinel's at the carpal tunnel   ASSESSMENT/PLAN:   No problem-specific Assessment & Plan notes found for this encounter.   Right ankle pain X-ray at urgent care showed no fracture or dislocation but did show small subchondral lucency in the medial talar dome suspicious for osteochondral lesion. Physical exam significant for mild tenderness posterior and inferior to lateral malleolus. Pain likely due to ankle sprain. Suspect subchondral lucency an incidental finding as patient does not have pain over that area. Provided patient with ankle brace and referred her for physical therapy. Will follow up in 3 weeks.   2. L knee pain Patient presents with infrapatellar knee pain.  Physical exam with full range of motion and strength, though with mild tenderness to palpation in infrapatellar region.  Get x-ray of left knee. Prescribed Voltaren gel to apply as needed, as well as icing and using over-the-counter pain medication as needed.  We will follow-up in 3 weeks.  3. Bilateral wrist pain Left wrist XR without fracture or dislocation.  Exam significant for tenderness to palpation along thenar eminence bilaterally. Pain likely due to contusions given location of pain without any other findings.  Patient can use Voltaren gel on this area, and ice as needed.  We will check on the pain at 3-week follow-up  Erica Collum, DO [facility]    Patient seen and evaluated with the resident.  I agree with the above plan of care.  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  We will start with some physical therapy for this.  She is also  fitted with a well fitting med spec brace.  Reassurance regarding her bilateral hand pain and we will take a watchful waiting approach for this.  We will also get x-rays of the left knee and she will follow-up in 3 weeks for check on her progress.  This note was dictated using Dragon naturally speaking software and may contain errors in syntax, spelling, or content which have not been identified prior to signing this note.

## 2021-10-02 IMAGING — DX DG CHEST 2V
2 series · 2 of 2 positions shown · non-contrast
Comparison: No priors.

CLINICAL DATA: 55-year-old female with history of shortness of
breath and cough.

EXAM:
CHEST - 2 VIEW

[chest pa]
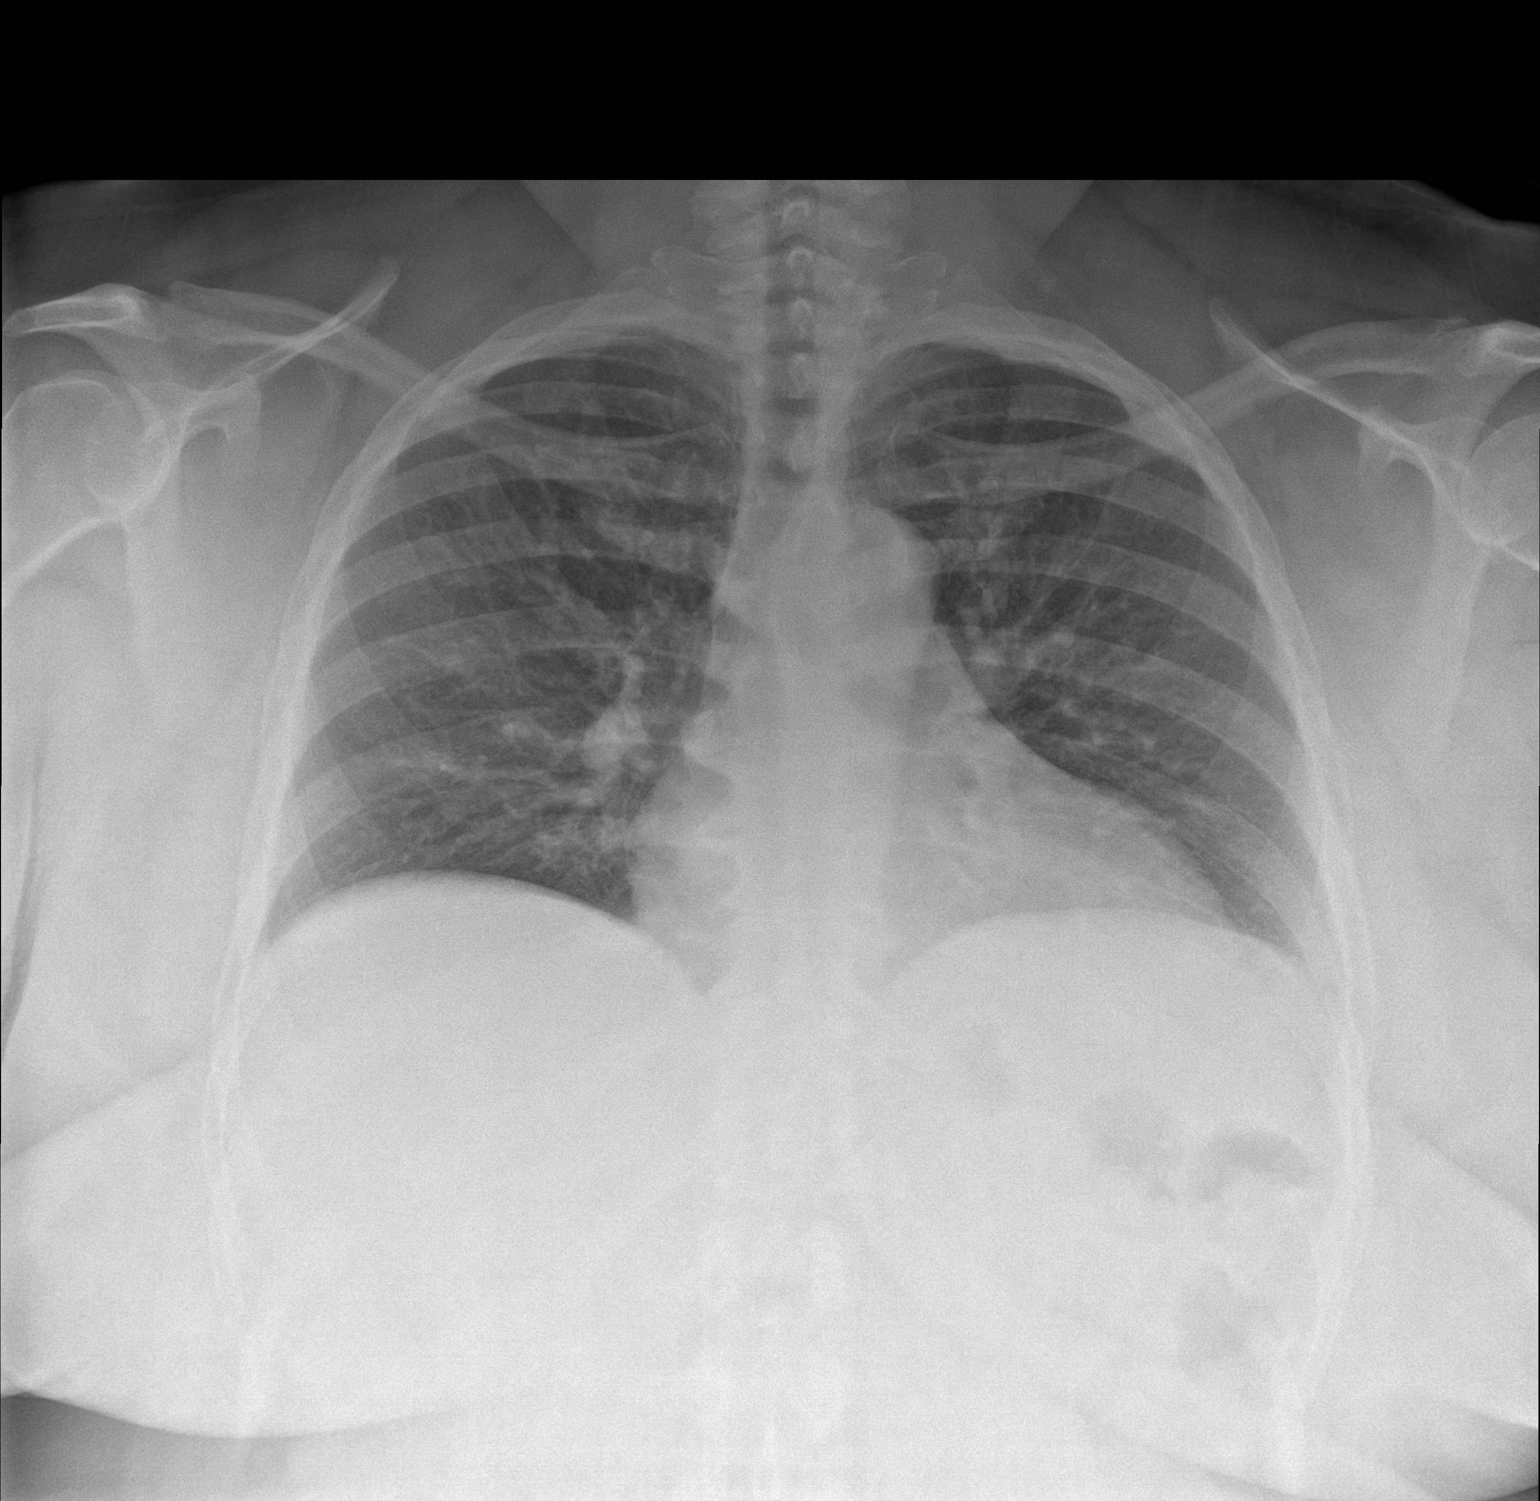

[chest lat]
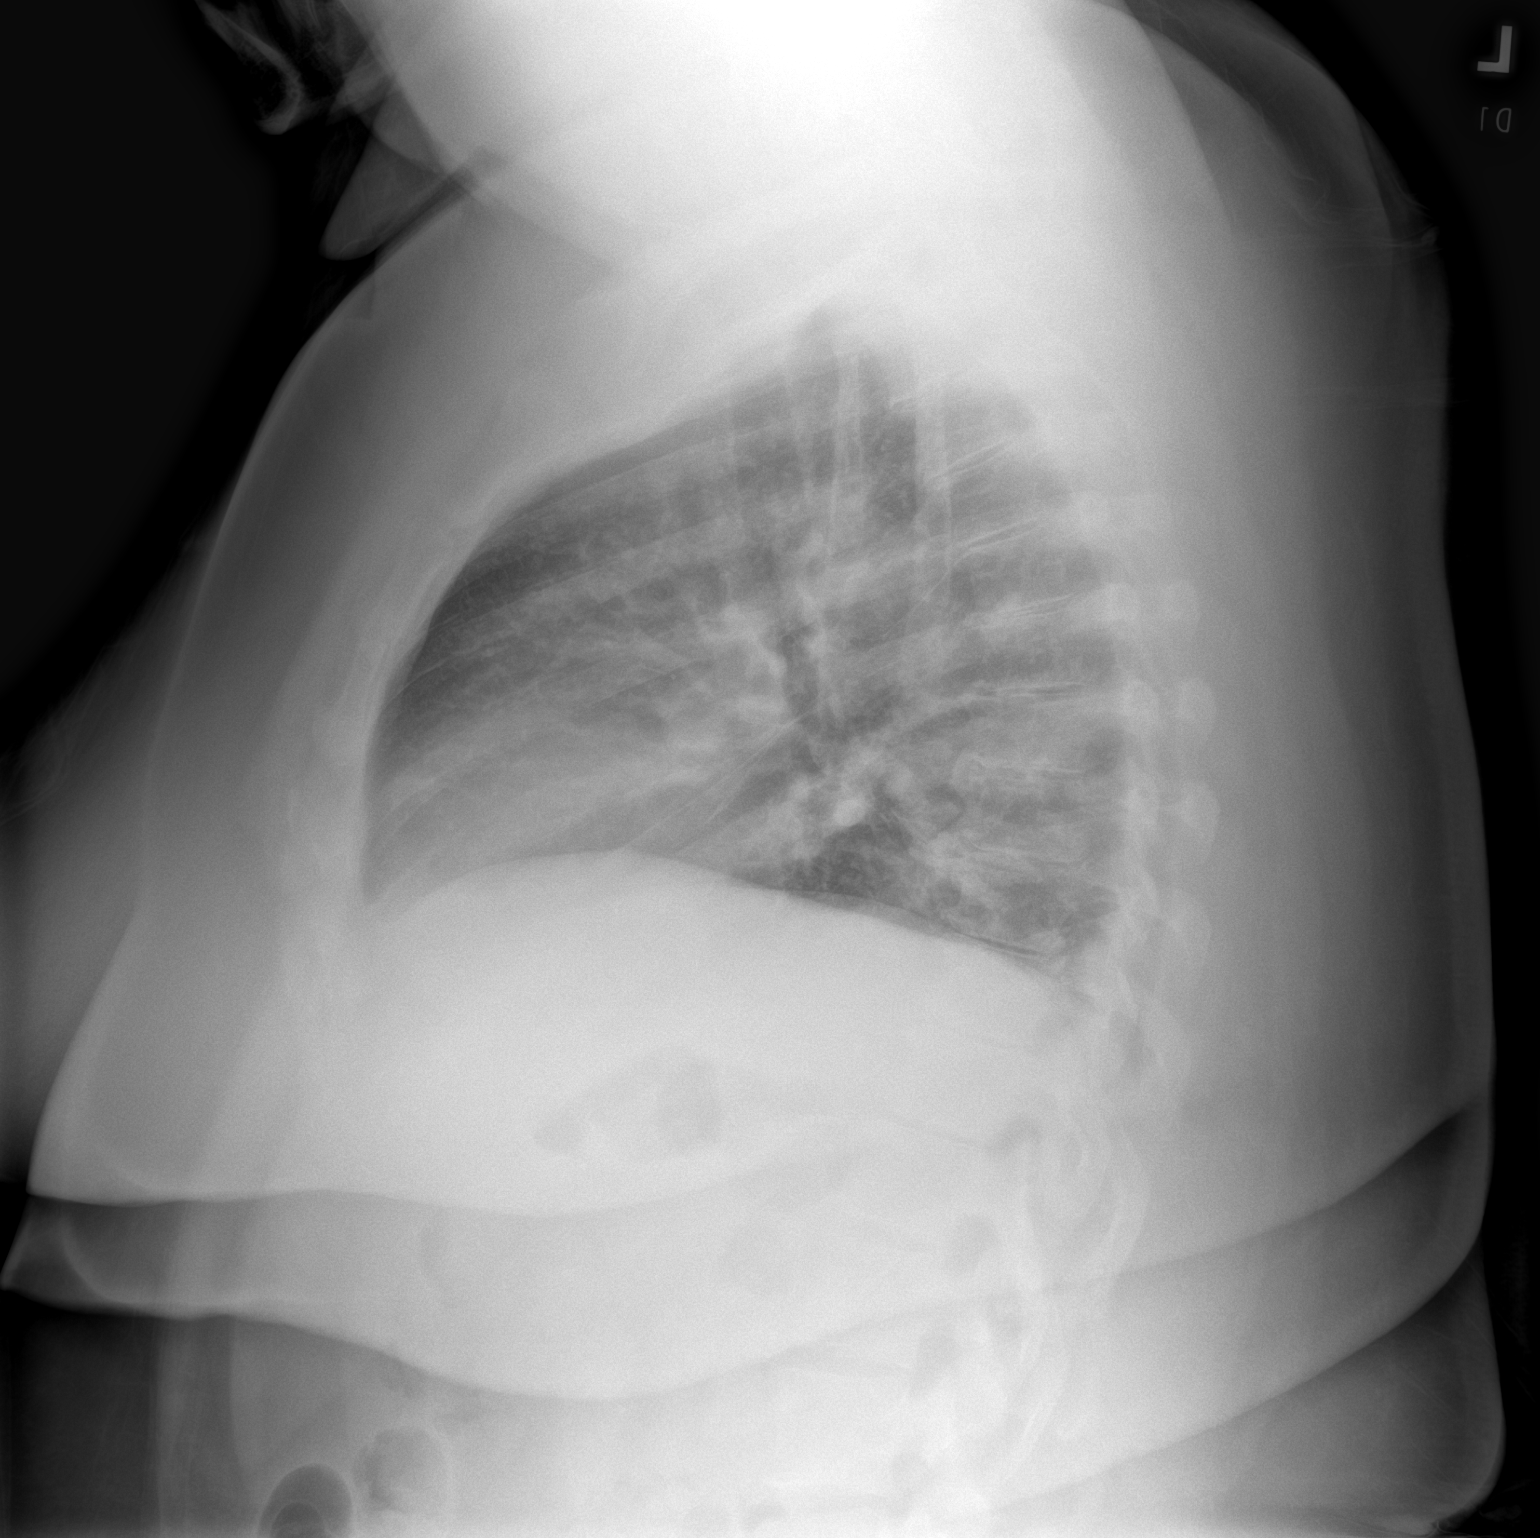

[2 of 2 positions shown; findings below may reference images not displayed]

FINDINGS: Lung volumes are low. No consolidative airspace disease. No pleural
effusions. No pneumothorax. No pulmonary nodule or mass noted.
Pulmonary vasculature and the cardiomediastinal silhouette are
within normal limits.
IMPRESSION: 1. Low lung volumes without radiographic evidence of acute
cardiopulmonary disease.

## 2021-10-03 ENCOUNTER — Ambulatory Visit
Admission: RE | Admit: 2021-10-03 | Discharge: 2021-10-03 | Disposition: A | Discharge status: Home / Self Care | Payer: 59 | Source: Ambulatory Visit | Attending: Sports Medicine | Admitting: Sports Medicine

## 2021-10-03 DIAGNOSIS — M25562 Pain in left knee: Secondary | ICD-10-CM | POA: Diagnosis not present

## 2021-10-09 ENCOUNTER — Other Ambulatory Visit (HOSPITAL_COMMUNITY): Payer: Self-pay

## 2021-10-16 ENCOUNTER — Other Ambulatory Visit: Payer: Self-pay

## 2021-10-16 ENCOUNTER — Ambulatory Visit: Payer: 59 | Attending: Sports Medicine | Admitting: Physical Therapy

## 2021-10-16 ENCOUNTER — Encounter: Payer: Self-pay | Admitting: Physical Therapy

## 2021-10-16 DIAGNOSIS — M25562 Pain in left knee: Secondary | ICD-10-CM | POA: Insufficient documentation

## 2021-10-16 DIAGNOSIS — M25571 Pain in right ankle and joints of right foot: Secondary | ICD-10-CM | POA: Diagnosis not present

## 2021-10-16 DIAGNOSIS — M6281 Muscle weakness (generalized): Secondary | ICD-10-CM | POA: Diagnosis not present

## 2021-10-16 NOTE — Therapy (Signed)
OUTPATIENT PHYSICAL THERAPY LOWER EXTREMITY EVALUATION   Patient Name: Erica Ferguson MRN: [id number] DOB:Dec 03, 1965, 56 y.o., female Today's Date: 10/16/2021   PT End of Session - 10/16/21 1428     Visit Number 1    Number of Visits --   1-2x/week   Date for PT Re-Evaluation 12/11/21    Authorization Type [employer]    PT Start Time 0147    PT Stop Time 0225    PT Time Calculation (min) 38 min             Past Medical History:  Diagnosis Date   Allergy    seasonal allergies   Angioedema    Urticaria    Past Surgical History:  Procedure Laterality Date   BREAST REDUCTION SURGERY  1989   REDUCTION MAMMAPLASTY     WISDOM TOOTH EXTRACTION Bilateral 1993   Patient Active Problem List   Diagnosis Date Noted   Primary hypertension 07/03/2021   Suspected sleep apnea 06/28/2021   Morbid obesity with BMI of 45.0-49.9, adult ([place]) 03/20/2021   Acute cystitis with hematuria 08/22/2020   Prediabetes 08/22/2020   Elevated blood pressure reading in office without diagnosis of hypertension 08/22/2020   B12 deficiency 08/22/2020    PCP: Fenton Foy, NP  REFERRING PROVIDER: Thurman Coyer, DO  THERAPY DIAG:  Pain in right ankle and joints of right foot  Left knee pain, unspecified chronicity  Muscle weakness  REFERRING DIAG: Acute right ankle pain [M25.571]  Rationale for Evaluation and Treatment Rehabilitation  SUBJECTIVE:  PERTINENT PAST HISTORY:  NA        PRECAUTIONS: None  WEIGHT BEARING RESTRICTIONS No  FALLS:  Has patient fallen in last 6 months? Yes, Number of falls: 1 leading to knee and ankle pain  MOI/History of condition:  Onset date: 09/14/2021  Erica COLESTOCK is a 56 y.o. female who presents to clinic with chief complaint of L knee and R lateral ankle pain.  She had a [address] in which she hit he knees and hurt both ankles while.  She was changing direction and fell down.  She is not sure exactly what happened, but the terrain was uneven.  She  reports that her main pain is in the L knee and R ankle at this point.  X-rays (-) for fracture.   From referring provider:  " Right ankle pain X-ray at urgent care showed no fracture or dislocation but did show small subchondral lucency in the medial talar dome suspicious for osteochondral lesion. Physical exam significant for mild tenderness posterior and inferior to lateral malleolus. Pain likely due to ankle sprain. Suspect subchondral lucency an incidental finding as patient does not have pain over that area. Provided patient with ankle brace and referred her for physical therapy. Will follow up in 3 weeks.    2. L knee pain Patient presents with infrapatellar knee pain.  Physical exam with full range of motion and strength, though with mild tenderness to palpation in infrapatellar region.  Get x-ray of left knee. Prescribed Voltaren gel to apply as needed, as well as icing and using over-the-counter pain medication as needed.  We will follow-up in 3 weeks."   Red flags:  denies   Pain:  Are you having pain? Yes Pain location: L knee (inferior patella) NPRS scale:  current 0/10  average 5/10  Aggravating factors: walking, sleeping, steps  NPRS, highest: 5/10 Relieving factors: rest  NPRS: best: 0/10 Pain description: intermittent and aching Stage: Subacute Stability: getting better  Pain:  Are you having pain? Yes Pain location: R lateral ankle NPRS scale:  highest 8/10 current 0/10  Aggravating factors: sleeping, walking Relieving factors: rest, ice Pain description: intermittent and aching Stage: Subacute Stability: getting better   Occupation: Desk job  Assistive Device: na  Hand Dominance: na  Patient Goals/Specific Activities: reduce pain, get back to normal   OBJECTIVE:   DIAGNOSTIC FINDINGS:  Knee x-ray: IMPRESSION: No acute fracture or dislocation.  Ankle x-ray: 1. Mild diffuse soft tissue swelling. No acute osseous abnormality. 2. Small  subchondral lucency in the medial talar dome, suspicious for osteochondral lesion.   GENERAL OBSERVATION/GAIT:   Slow gait, wide BOS  SENSATION:  Light touch: Appears intact  PALPATION: R ankle TTP ATFL and fibularis tendon    LE MMT:  MMT Right 10/16/2021 Left 10/16/2021  Hip flexion (L2, L3) 4 4  Knee extension (L3) 4 4*  Knee flexion 4 4  Hip abduction    Hip extension    Hip external rotation    Hip internal rotation    Hip adduction    Ankle dorsiflexion (L4) 5 5  Ankle plantarflexion (S1) Unable to SL HR   Ankle inversion 5   Ankle eversion 5   Great Toe ext (L5)    Grossly     (Blank rows = not tested, score listed is out of 5 possible points.  N = WNL, D = diminished, C = clear for gross weakness with myotome testing, * = concordant pain with testing)  LE ROM:  ROM Right 10/16/2021 Left 10/16/2021  Hip flexion    Hip extension    Hip abduction    Hip adduction    Hip internal rotation    Hip external rotation    Knee flexion n n  Knee extension n n  Ankle dorsiflexion n n  Ankle plantarflexion n n  Ankle inversion n n  Ankle eversion n n   (Blank rows = not tested, N = WNL, * = concordant pain with testing)  Functional Tests  Eval (10/16/2021)    Progressive balance screen (highest level completed for >/= 10''):  SLS: R 10'', L 10''                                                            PATIENT SURVEYS:  FOTO 55->71   TODAY'S TREATMENT: Creating, reviewing, and completing below HEP   PATIENT EDUCATION:  POC, diagnosis, prognosis, HEP, and outcome measures.  Pt educated via explanation, demonstration, and handout (HEP).  Pt confirms understanding verbally.   HOME EXERCISE PROGRAM: Access Code: [email or web address] URL: [email or web address] Date: 10/16/2021 Prepared by: Alphonzo Severance  Exercises - Long Sitting Ankle Plantar Flexion with Resistance  - 1 x daily - 7 x weekly - 3 sets - 10 reps - Long Sitting Ankle  Eversion with Resistance  - 1 x daily - 7 x weekly - 3 sets - 10 reps - Supine Quad Set  - 3 x daily - 7 x weekly - 2 sets - 10 reps - 10 hold - Small Range Straight Leg Raise  - 1 x daily - 7 x weekly - 3 sets - 10 reps  ASTERISK SIGNS   Asterisk Signs Eval (10/16/2021)       Max pain R ankle 8/10  S/L heel raise  Partial ROm       L SLR painful                         ASSESSMENT:  CLINICAL IMPRESSION: Erica Ferguson is a 56 y.o. female who presents to clinic with signs and sxs consistent with R ankle pain secondary to inversion ankle sprain.  She does have some TTP over ATFL and fibularis tendon.  Her L knee pain is isolated to L patellar tendon and is consistent with blunt trauma to this area.  Both complaints are improving with time.  She has no gross weakness or balance deficits with her L ankle or knee.  I issued an HEP and she will follow up in a few weeks for recheck and HEP update.  OBJECTIVE IMPAIRMENTS: Pain, LE and ankle weakness  ACTIVITY LIMITATIONS: walking, bending, stair navigation without pain  PERSONAL FACTORS: See medical history and pertinent history   REHAB POTENTIAL: Good  CLINICAL DECISION MAKING: Stable/uncomplicated  EVALUATION COMPLEXITY: Low   GOALS:   SHORT TERM GOALS: Target date: 11/06/2021  Erica Ferguson will be >75% HEP compliant to improve carryover between sessions and facilitate independent management of condition  Evaluation (10/16/2021): ongoing Goal status: INITIAL   LONG TERM GOALS: Target date: 12/11/2021  Erica Ferguson will improve FOTO score to 71 as a proxy for functional improvement  Evaluation/Baseline (10/16/2021): 55 Goal status: INITIAL   2.  Erica Ferguson will self report >/= 50% decrease in pain from evaluation   Evaluation/Baseline (10/16/2021): 8/10 max pain Goal status: INITIAL   3.  Erica Ferguson will be able to complete work and shopping, not limited by pain  Evaluation/Baseline (10/16/2021): limited Goal status: INITIAL   4.  Erica Ferguson will report  confidence in self management of condition at time of discharge with advanced HEP  Evaluation/Baseline (10/16/2021): unable to self manage Goal status: INITIAL   PLAN: PT FREQUENCY: 1-2x/week  PT DURATION: 8 weeks (Ending 12/11/2021)  PLANNED INTERVENTIONS: Therapeutic exercises, Aquatic therapy, Therapeutic activity, Neuro Muscular re-education, Gait training, Patient/Family education, Joint mobilization, Dry Needling, Electrical stimulation, Spinal mobilization and/or manipulation, Moist heat, Taping, Vasopneumatic device, Ionotophoresis 4mg /ml Dexamethasone, and Manual therapy  PLAN FOR NEXT SESSION: progressive knee and ankle strengthening, balance, gait   PT, DPT 10/16/2021, 2:32 PM

## 2021-10-22 ENCOUNTER — Ambulatory Visit: Payer: 59 | Admitting: Sports Medicine

## 2021-11-05 DIAGNOSIS — H16223 Keratoconjunctivitis sicca, not specified as Sjogren's, bilateral: Secondary | ICD-10-CM | POA: Diagnosis not present

## 2021-11-05 DIAGNOSIS — H5712 Ocular pain, left eye: Secondary | ICD-10-CM | POA: Diagnosis not present

## 2021-11-06 ENCOUNTER — Ambulatory Visit: Payer: 59 | Admitting: Physical Therapy

## 2021-11-13 ENCOUNTER — Ambulatory Visit: Payer: 59 | Attending: Sports Medicine | Admitting: Physical Therapy

## 2021-11-13 NOTE — Therapy (Deleted)
OUTPATIENT PHYSICAL THERAPY TREATMENT NOTE   Patient Name: Erica Ferguson MRN: [id number] DOB:10/28/1965, 56 y.o., female Today's Date: 11/13/2021  PCP: Ivonne Andrew, NP   REFERRING PROVIDER: Ralene Cork, DO    Past Medical History:  Diagnosis Date   Allergy    seasonal allergies   Angioedema    Urticaria    Past Surgical History:  Procedure Laterality Date   BREAST REDUCTION SURGERY  1989   REDUCTION MAMMAPLASTY     WISDOM TOOTH EXTRACTION Bilateral 1993   Patient Active Problem List   Diagnosis Date Noted   Primary hypertension 07/03/2021   Suspected sleep apnea 06/28/2021   Morbid obesity with BMI of 45.0-49.9, adult ([facility]) 03/20/2021   Acute cystitis with hematuria 08/22/2020   Prediabetes 08/22/2020   Elevated blood pressure reading in office without diagnosis of hypertension 08/22/2020   B12 deficiency 08/22/2020    THERAPY DIAG:  No diagnosis found.  REFERRING DIAG: Acute right ankle pain [M25.571]  PERTINENT HISTORY: none  PRECAUTIONS/RESTRICTIONS:   none  SUBJECTIVE:  ***  Pain:  Are you having pain? Yes Pain location: L knee (inferior patella) NPRS scale:  current 0/10  Aggravating factors: walking, sleeping, steps Relieving factors: rest Pain description: intermittent and aching Stage: Subacute  Are you having pain? Yes Pain location: R lateral ankle NPRS scale:  highest 8/10 Aggravating factors: sleeping, walking Relieving factors: rest, ice Pain description: intermittent and aching Stage: Subacute  OBJECTIVE: (objective measures completed at initial evaluation unless otherwise dated)  DIAGNOSTIC FINDINGS:  Knee x-ray: IMPRESSION: No acute fracture or dislocation.   Ankle x-ray: 1. Mild diffuse soft tissue swelling. No acute osseous abnormality. 2. Small subchondral lucency in the medial talar dome, suspicious for osteochondral lesion.             GENERAL OBSERVATION/GAIT:                     Slow gait, wide BOS    SENSATION:          Light touch: Appears intact   PALPATION: R ankle TTP ATFL and fibularis tendon       LE MMT:   MMT Right 10/16/2021 Left 10/16/2021  Hip flexion (L2, L3) 4 4  Knee extension (L3) 4 4*  Knee flexion 4 4  Hip abduction      Hip extension      Hip external rotation      Hip internal rotation      Hip adduction      Ankle dorsiflexion (L4) 5 5  Ankle plantarflexion (S1) Unable to SL HR    Ankle inversion 5    Ankle eversion 5    Great Toe ext (L5)      Grossly        (Blank rows = not tested, score listed is out of 5 possible points.  N = WNL, D = diminished, C = clear for gross weakness with myotome testing, * = concordant pain with testing)   LE ROM:   ROM Right 10/16/2021 Left 10/16/2021  Hip flexion      Hip extension      Hip abduction      Hip adduction      Hip internal rotation      Hip external rotation      Knee flexion n n  Knee extension n n  Ankle dorsiflexion n n  Ankle plantarflexion n n  Ankle inversion n n  Ankle eversion n n    (  Blank rows = not tested, N = WNL, * = concordant pain with testing)   Functional Tests   Eval (10/16/2021)      Progressive balance screen (highest level completed for >/= 10''):   SLS: R 10'', L 10''                                                                                                        PATIENT SURVEYS:  FOTO 55->71     TODAY'S TREATMENT: Creating, reviewing, and completing below HEP     PATIENT EDUCATION:  POC, diagnosis, prognosis, HEP, and outcome measures.  Pt educated via explanation, demonstration, and handout (HEP).  Pt confirms understanding verbally.    HOME EXERCISE PROGRAM: Access Code: [email or web address] URL: [email or web address] Date: 10/16/2021 Prepared by: Alphonzo Severance   Exercises - Long Sitting Ankle Plantar Flexion with Resistance  - 1 x daily - 7 x weekly - 3 sets - 10 reps - Long Sitting Ankle Eversion with Resistance  - 1 x daily -  7 x weekly - 3 sets - 10 reps - Supine Quad Set  - 3 x daily - 7 x weekly - 2 sets - 10 reps - 10 hold - Small Range Straight Leg Raise  - 1 x daily - 7 x weekly - 3 sets - 10 reps   ASTERISK SIGNS     Asterisk Signs Eval (10/16/2021)            Max pain R ankle 8/10            S/L heel raise  Partial ROM            L SLR painful                                              TREATMENT ***:  Therapeutic Exercise: - ***  Manual Therapy: - ***  Neuromuscular re-ed: - ***  Therapeutic Activity: - ***  Self-care/Home Management: - ***  ASSESSMENT:   CLINICAL IMPRESSION: ***   OBJECTIVE IMPAIRMENTS: Pain, LE and ankle weakness   ACTIVITY LIMITATIONS: walking, bending, stair navigation without pain   PERSONAL FACTORS: See medical history and pertinent history     REHAB POTENTIAL: Good   CLINICAL DECISION MAKING: Stable/uncomplicated   EVALUATION COMPLEXITY: Low     GOALS:     SHORT TERM GOALS: Target date: 11/06/2021   [name] will be >75% HEP compliant to improve carryover between sessions and facilitate independent management of condition   Evaluation (10/16/2021): ongoing Goal status: INITIAL     LONG TERM GOALS: Target date: 12/11/2021   [name] will improve FOTO score to 71 as a proxy for functional improvement   Evaluation/Baseline (10/16/2021): 55 Goal status: INITIAL     2.  [name] will self report >/= 50% decrease in pain from evaluation    Evaluation/Baseline (10/16/2021): 8/10 max pain Goal status: INITIAL     3.  [name] will be able to complete  work and shopping, not limited by pain   Evaluation/Baseline (10/16/2021): limited Goal status: INITIAL     4.  [name] will report confidence in self management of condition at time of discharge with advanced HEP   Evaluation/Baseline (10/16/2021): unable to self manage Goal status: INITIAL     PLAN: PT FREQUENCY: 1-2x/week   PT DURATION: 8 weeks (Ending 12/11/2021)   PLANNED INTERVENTIONS:  Therapeutic exercises, Aquatic therapy, Therapeutic activity, Neuro Muscular re-education, Gait training, Patient/Family education, Joint mobilization, Dry Needling, Electrical stimulation, Spinal mobilization and/or manipulation, Moist heat, Taping, Vasopneumatic device, Ionotophoresis 4mg /ml Dexamethasone, and Manual therapy   PLAN FOR NEXT SESSION: progressive knee and ankle strengthening, balance, gait   Gaytha Raybourn PT 11/13/2021, 11:29 AM

## 2021-11-20 ENCOUNTER — Ambulatory Visit: Payer: 59 | Admitting: Physical Therapy

## 2021-11-20 NOTE — Therapy (Deleted)
OUTPATIENT PHYSICAL THERAPY TREATMENT NOTE   Patient Name: Erica Ferguson MRN: [id number] DOB:04-30-1965, 56 y.o., female Today's Date: 11/20/2021  PCP: Ivonne Andrew, NP   REFERRING PROVIDER: Ralene Cork, DO    Past Medical History:  Diagnosis Date   Allergy    seasonal allergies   Angioedema    Urticaria    Past Surgical History:  Procedure Laterality Date   BREAST REDUCTION SURGERY  1989   REDUCTION MAMMAPLASTY     WISDOM TOOTH EXTRACTION Bilateral 1993   Patient Active Problem List   Diagnosis Date Noted   Primary hypertension 07/03/2021   Suspected sleep apnea 06/28/2021   Morbid obesity with BMI of 45.0-49.9, adult ([facility]) 03/20/2021   Acute cystitis with hematuria 08/22/2020   Prediabetes 08/22/2020   Elevated blood pressure reading in office without diagnosis of hypertension 08/22/2020   B12 deficiency 08/22/2020    THERAPY DIAG:  No diagnosis found.  REFERRING DIAG: Acute right ankle pain [M25.571]  PERTINENT HISTORY: NA  PRECAUTIONS/RESTRICTIONS:   None  SUBJECTIVE:  ***  Pain:  Are you having pain? Yes Pain location: R lateral ankle NPRS scale:  highest 8/10 current 0/10  Aggravating factors: sleeping, walking Relieving factors: rest, ice Pain description: intermittent and aching Stage: Subacute Stability: getting better  OBJECTIVE: (objective measures completed at initial evaluation unless otherwise dated)  DIAGNOSTIC FINDINGS:  Knee x-ray: IMPRESSION: No acute fracture or dislocation.   Ankle x-ray: 1. Mild diffuse soft tissue swelling. No acute osseous abnormality. 2. Small subchondral lucency in the medial talar dome, suspicious for osteochondral lesion.             GENERAL OBSERVATION/GAIT:                     Slow gait, wide BOS   SENSATION:          Light touch: Appears intact   PALPATION: R ankle TTP ATFL and fibularis tendon       LE MMT:   MMT Right 10/16/2021 Left 10/16/2021  Hip flexion (L2, L3) 4 4   Knee extension (L3) 4 4*  Knee flexion 4 4  Hip abduction      Hip extension      Hip external rotation      Hip internal rotation      Hip adduction      Ankle dorsiflexion (L4) 5 5  Ankle plantarflexion (S1) Unable to SL HR    Ankle inversion 5    Ankle eversion 5    Great Toe ext (L5)      Grossly        (Blank rows = not tested, score listed is out of 5 possible points.  N = WNL, D = diminished, C = clear for gross weakness with myotome testing, * = concordant pain with testing)   LE ROM:   ROM Right 10/16/2021 Left 10/16/2021  Hip flexion      Hip extension      Hip abduction      Hip adduction      Hip internal rotation      Hip external rotation      Knee flexion n n  Knee extension n n  Ankle dorsiflexion n n  Ankle plantarflexion n n  Ankle inversion n n  Ankle eversion n n    (Blank rows = not tested, N = WNL, * = concordant pain with testing)   Functional Tests   Eval (10/16/2021)  Progressive balance screen (highest level completed for >/= 10''):   SLS: R 10'', L 10''                                                                                                        PATIENT SURVEYS:  FOTO 55->71     TODAY'S TREATMENT: Creating, reviewing, and completing below HEP     PATIENT EDUCATION:  POC, diagnosis, prognosis, HEP, and outcome measures.  Pt educated via explanation, demonstration, and handout (HEP).  Pt confirms understanding verbally.    HOME EXERCISE PROGRAM: Access Code: [email or web address] URL: [email or web address] Date: 10/16/2021 Prepared by: Alphonzo Severance   Exercises - Long Sitting Ankle Plantar Flexion with Resistance  - 1 x daily - 7 x weekly - 3 sets - 10 reps - Long Sitting Ankle Eversion with Resistance  - 1 x daily - 7 x weekly - 3 sets - 10 reps - Supine Quad Set  - 3 x daily - 7 x weekly - 2 sets - 10 reps - 10 hold - Small Range Straight Leg Raise  - 1 x daily - 7 x weekly - 3 sets - 10 reps    ASTERISK SIGNS     Asterisk Signs Eval (10/16/2021)            Max pain R ankle 8/10            S/L heel raise  Partial ROM            L SLR painful                                              TREATMENT ***:  Therapeutic Exercise: - ***  Manual Therapy: - ***  Neuromuscular re-ed: - ***  Therapeutic Activity: - ***  Self-care/Home Management: - ***  ASSESSMENT:   CLINICAL IMPRESSION: ***   OBJECTIVE IMPAIRMENTS: Pain, LE and ankle weakness   ACTIVITY LIMITATIONS: walking, bending, stair navigation without pain   PERSONAL FACTORS: See medical history and pertinent history     REHAB POTENTIAL: Good   CLINICAL DECISION MAKING: Stable/uncomplicated   EVALUATION COMPLEXITY: Low     GOALS:     SHORT TERM GOALS: Target date: 11/06/2021   Braileigh will be >75% HEP compliant to improve carryover between sessions and facilitate independent management of condition   Evaluation (10/16/2021): ongoing Goal status: INITIAL     LONG TERM GOALS: Target date: 12/11/2021   Shella will improve FOTO score to 71 as a proxy for functional improvement   Evaluation/Baseline (10/16/2021): 55 Goal status: INITIAL     2.  Arabia will self report >/= 50% decrease in pain from evaluation    Evaluation/Baseline (10/16/2021): 8/10 max pain Goal status: INITIAL     3.  Darion will be able to complete work and shopping, not limited by pain   Evaluation/Baseline (10/16/2021): limited Goal status: INITIAL     4.  Jaiyah will report confidence in self  management of condition at time of discharge with advanced HEP   Evaluation/Baseline (10/16/2021): unable to self manage Goal status: INITIAL     PLAN: PT FREQUENCY: 1-2x/week   PT DURATION: 8 weeks (Ending 12/11/2021)   PLANNED INTERVENTIONS: Therapeutic exercises, Aquatic therapy, Therapeutic activity, Neuro Muscular re-education, Gait training, Patient/Family education, Joint mobilization, Dry Needling, Electrical stimulation,  Spinal mobilization and/or manipulation, Moist heat, Taping, Vasopneumatic device, Ionotophoresis 4mg /ml Dexamethasone, and Manual therapy   PLAN FOR NEXT SESSION: progressive knee and ankle strengthening, balance, gait   Pearly Bartosik PT 11/20/2021, 3:35 PM

## 2021-12-10 ENCOUNTER — Telehealth: Payer: 59 | Admitting: Physician Assistant

## 2021-12-10 ENCOUNTER — Other Ambulatory Visit (HOSPITAL_BASED_OUTPATIENT_CLINIC_OR_DEPARTMENT_OTHER): Payer: Self-pay

## 2021-12-10 DIAGNOSIS — J208 Acute bronchitis due to other specified organisms: Secondary | ICD-10-CM | POA: Diagnosis not present

## 2021-12-10 DIAGNOSIS — H524 Presbyopia: Secondary | ICD-10-CM | POA: Diagnosis not present

## 2021-12-10 DIAGNOSIS — H5211 Myopia, right eye: Secondary | ICD-10-CM | POA: Diagnosis not present

## 2021-12-10 MED ORDER — FLUTICASONE PROPIONATE 50 MCG/ACT NA SUSP
2.0000 | Freq: Every day | NASAL | 0 refills | Status: DC
  Filled 2021-12-10: qty 16, 30d supply, fill #0

## 2021-12-10 MED ORDER — ALBUTEROL SULFATE HFA 108 (90 BASE) MCG/ACT IN AERS
2.0000 | INHALATION_SPRAY | Freq: Four times a day (QID) | RESPIRATORY_TRACT | 0 refills | Status: DC | PRN
  Filled 2021-12-10: qty 6.7, 25d supply, fill #0

## 2021-12-10 MED ORDER — PREDNISONE 20 MG PO TABS
40.0000 mg | ORAL_TABLET | Freq: Every day | ORAL | 0 refills | Status: DC
  Filled 2021-12-10: qty 6, 3d supply, fill #0

## 2021-12-10 MED ORDER — ALBUTEROL SULFATE HFA 108 (90 BASE) MCG/ACT IN AERS
2.0000 | INHALATION_SPRAY | Freq: Four times a day (QID) | RESPIRATORY_TRACT | 0 refills | Status: DC | PRN
  Filled 2021-12-10: qty 8, fill #0

## 2021-12-10 NOTE — Progress Notes (Signed)
Virtual Visit Consent   Erica Ferguson, you are scheduled for a virtual visit with a [facility] provider today. Just as with appointments in the office, your consent must be obtained to participate. Your consent will be active for this visit and any virtual visit you may have with one of our providers in the next 365 days. If you have a MyChart account, a copy of this consent can be sent to you electronically.  As this is a virtual visit, video technology does not allow for your provider to perform a traditional examination. This may limit your provider's ability to fully assess your condition. If your provider identifies any concerns that need to be evaluated in person or the need to arrange testing (such as labs, EKG, etc.), we will make arrangements to do so. Although advances in technology are sophisticated, we cannot ensure that it will always work on either your end or our end. If the connection with a video visit is poor, the visit may have to be switched to a telephone visit. With either a video or telephone visit, we are not always able to ensure that we have a secure connection.  By engaging in this virtual visit, you consent to the provision of healthcare and authorize for your insurance to be billed (if applicable) for the services provided during this visit. Depending on your insurance coverage, you may receive a charge related to this service.  I need to obtain your verbal consent now. Are you willing to proceed with your visit today? Erica Ferguson has provided verbal consent on 12/10/2021 for a virtual visit (video or telephone). Leeanne Rio, [place]  Date: 12/10/2021 7:55 AM  Virtual Visit via Video Note   I, Leeanne Rio, connected with  Erica Ferguson  ([id number], 1965/04/02) on 12/10/21 at  7:45 AM EDT by a video-enabled telemedicine application and verified that I am speaking with the correct person using two identifiers.  Location: Patient: Virtual Visit Location  Patient: Home Provider: Virtual Visit Location Provider: Home   I discussed the limitations of evaluation and management by telemedicine and the availability of in person appointments. The patient expressed understanding and agreed to proceed.    History of Present Illness: Erica Ferguson is a 56 y.o. who identifies as a female who was assigned female at birth, and is being seen today for URI symptoms starting over weekend with some nasal congestion and cough, now with chest tightness, wheezing and frequent dry coughing spells. Denies fever, chills. Denies recent travel or sick contact. Denies history of asthma or COPD. Took home COVID tests which were negative.Marland Kitchen    HPI: HPI  Problems:  Patient Active Problem List   Diagnosis Date Noted   Primary hypertension 07/03/2021   Suspected sleep apnea 06/28/2021   Morbid obesity with BMI of 45.0-49.9, adult ([place]) 03/20/2021   Acute cystitis with hematuria 08/22/2020   Prediabetes 08/22/2020   Elevated blood pressure reading in office without diagnosis of hypertension 08/22/2020   B12 deficiency 08/22/2020    Allergies:  Allergies  Allergen Reactions   Pork Allergy Hives and Swelling   Medications:  Current Outpatient Medications:    albuterol (VENTOLIN HFA) 108 (90 Base) MCG/ACT inhaler, Inhale 2 puffs into the lungs every 6 (six) hours as needed for wheezing or shortness of breath., Disp: 8 g, Rfl: 0   fluticasone (FLONASE) 50 MCG/ACT nasal spray, Place 2 sprays into both nostrils daily., Disp: 16 g, Rfl: 0   predniSONE (DELTASONE) 20  MG tablet, Take 2 tablets (40 mg total) by mouth daily with breakfast., Disp: 6 tablet, Rfl: 0   EPINEPHrine 0.3 mg/0.3 mL IJ SOAJ injection, For acute onset of lip swelling and airway obstruction, inject 1 pen into thigh muscle, call 911 and note time of first injection.  Wait 5 minutes.  If symptoms have not completely resolved, give second injection in other thigh muscle and note the time of second  injection. (Patient taking differently: For acute onset of lip swelling and airway obstruction, inject 1 pen into thigh muscle, call 911 and note time of first injection.  Wait 5 minutes.  If symptoms have not completely resolved, give second injection in other thigh muscle and note the time of second injection.), Disp: 2 each, Rfl: 5  Observations/Objective: Patient is well-developed, well-nourished in no acute distress.  Resting comfortably at home.  Head is normocephalic, atraumatic.  No labored breathing. Speech is clear and coherent with logical content.  Patient is alert and oriented at baseline.   Assessment and Plan: 1. Viral bronchitis - fluticasone (FLONASE) 50 MCG/ACT nasal spray; Place 2 sprays into both nostrils daily.  Dispense: 16 g; Refill: 0 - albuterol (VENTOLIN HFA) 108 (90 Base) MCG/ACT inhaler; Inhale 2 puffs into the lungs every 6 (six) hours as needed for wheezing or shortness of breath.  Dispense: 8 g; Refill: 0 - predniSONE (DELTASONE) 20 MG tablet; Take 2 tablets (40 mg total) by mouth daily with breakfast.  Dispense: 6 tablet; Refill: 0  Negative COVID testing. Rx Albuterol and Flonase.  Increase fluids.  Rest.  Saline nasal spray.  Probiotic.  Mucinex as directed.  Humidifier in bedroom. Will give 3-day course of prednisone to calm bronchospasm and wheezing.  Call or return to clinic if symptoms are not improving.   Follow Up Instructions: I discussed the assessment and treatment plan with the patient. The patient was provided an opportunity to ask questions and all were answered. The patient agreed with the plan and demonstrated an understanding of the instructions.  A copy of instructions were sent to the patient via MyChart unless otherwise noted below.   The patient was advised to call back or seek an in-person evaluation if the symptoms worsen or if the condition fails to improve as anticipated.  Time:  I spent 10 minutes with the patient via telehealth  technology discussing the above problems/concerns.    Leeanne Rio, PA-C

## 2021-12-10 NOTE — Patient Instructions (Signed)
Alice Reichert, thank you for joining Piedad Climes, PA-C for today's virtual visit.  While this provider is not your primary care provider (PCP), if your PCP is located in our provider database this encounter information will be shared with them immediately following your visit.  Consent: (Patient) Alice Reichert provided verbal consent for this virtual visit at the beginning of the encounter.  Current Medications:  Current Outpatient Medications:    EPINEPHrine 0.3 mg/0.3 mL IJ SOAJ injection, For acute onset of lip swelling and airway obstruction, inject 1 pen into thigh muscle, call 911 and note time of first injection.  Wait 5 minutes.  If symptoms have not completely resolved, give second injection in other thigh muscle and note the time of second injection. (Patient taking differently: For acute onset of lip swelling and airway obstruction, inject 1 pen into thigh muscle, call 911 and note time of first injection.  Wait 5 minutes.  If symptoms have not completely resolved, give second injection in other thigh muscle and note the time of second injection.), Disp: 2 each, Rfl: 5   Medications ordered in this encounter:  No orders of the defined types were placed in this encounter.    *If you need refills on other medications prior to your next appointment, please contact your pharmacy*  Follow-Up: Call back or seek an in-person evaluation if the symptoms worsen or if the condition fails to improve as anticipated.  [facility] [phone]  Other Instructions We are sorry that you are not feeling well.  Here is how we plan to help!  Based on your presentation I believe you most likely have A cough due to a virus.  This is called viral bronchitis and is best treated by rest, plenty of fluids and control of the cough.  You may use Ibuprofen or Tylenol as directed to help your symptoms.  I have sent in a nasal steroid spray (Flonase), an oral steroid (Prednisone) and  an inhaler (albuterol) to use as directed.  From your responses in the eVisit questionnaire you describe inflammation in the upper respiratory tract which is causing a significant cough.  This is commonly called Bronchitis and has four common causes:   Allergies Viral Infections Acid Reflux Bacterial Infection Allergies, viruses and acid reflux are treated by controlling symptoms or eliminating the cause. An example might be a cough caused by taking certain blood pressure medications. You stop the cough by changing the medication. Another example might be a cough caused by acid reflux. Controlling the reflux helps control the cough.  USE OF BRONCHODILATOR ("RESCUE") INHALERS: There is a risk from using your bronchodilator too frequently.  The risk is that over-reliance on a medication which only relaxes the muscles surrounding the breathing tubes can reduce the effectiveness of medications prescribed to reduce swelling and congestion of the tubes themselves.  Although you feel brief relief from the bronchodilator inhaler, your asthma may actually be worsening with the tubes becoming more swollen and filled with mucus.  This can delay other crucial treatments, such as oral steroid medications. If you need to use a bronchodilator inhaler daily, several times per day, you should discuss this with your provider.  There are probably better treatments that could be used to keep your asthma under control.     HOME CARE Only take medications as instructed by your medical team. Complete the entire course of an antibiotic. Drink plenty of fluids and get plenty of rest. Avoid close contacts especially  the very young and the elderly Cover your mouth if you cough or cough into your sleeve. Always remember to wash your hands A steam or ultrasonic humidifier can help congestion.   GET HELP RIGHT AWAY IF: You develop worsening fever. You become short of breath You cough up blood. Your symptoms persist after  you have completed your treatment plan MAKE SURE YOU  Understand these instructions. Will watch your condition. Will get help right away if you are not doing well or get worse.      If you have been instructed to have an in-person evaluation today at a local Urgent Care facility, please use the link below. It will take you to a list of all of our available [facility], including address, phone number and hours of operation. Please do not delay care.  [facility]  If you or a family member do not have a primary care provider, use the link below to schedule a visit and establish care. When you choose a [facility] primary care physician or advanced practice provider, you gain a long-term partner in health. Find a Primary Care Provider  Learn more about [facility] in-office and virtual care options: [facility]

## 2021-12-12 ENCOUNTER — Other Ambulatory Visit (HOSPITAL_BASED_OUTPATIENT_CLINIC_OR_DEPARTMENT_OTHER): Payer: Self-pay

## 2021-12-12 DIAGNOSIS — R7303 Prediabetes: Secondary | ICD-10-CM | POA: Diagnosis not present

## 2021-12-12 DIAGNOSIS — I10 Essential (primary) hypertension: Secondary | ICD-10-CM | POA: Diagnosis not present

## 2021-12-12 DIAGNOSIS — R5383 Other fatigue: Secondary | ICD-10-CM | POA: Diagnosis not present

## 2021-12-12 DIAGNOSIS — E119 Type 2 diabetes mellitus without complications: Secondary | ICD-10-CM | POA: Diagnosis not present

## 2021-12-12 DIAGNOSIS — J40 Bronchitis, not specified as acute or chronic: Secondary | ICD-10-CM | POA: Diagnosis not present

## 2021-12-12 MED ORDER — AMOXICILLIN-POT CLAVULANATE 500-125 MG PO TABS
ORAL_TABLET | ORAL | 0 refills | Status: DC
  Filled 2021-12-12: qty 20, 10d supply, fill #0

## 2021-12-12 MED ORDER — OLMESARTAN MEDOXOMIL-HCTZ 20-12.5 MG PO TABS
1.0000 | ORAL_TABLET | Freq: Every day | ORAL | 0 refills | Status: DC
  Filled 2021-12-12: qty 30, 30d supply, fill #0

## 2022-01-07 ENCOUNTER — Other Ambulatory Visit (HOSPITAL_COMMUNITY): Payer: Self-pay | Admitting: Nurse Practitioner

## 2022-01-07 ENCOUNTER — Encounter (HOSPITAL_COMMUNITY): Payer: Self-pay | Admitting: Nurse Practitioner

## 2022-01-07 ENCOUNTER — Other Ambulatory Visit (HOSPITAL_COMMUNITY): Payer: Self-pay

## 2022-01-07 ENCOUNTER — Ambulatory Visit (HOSPITAL_COMMUNITY): Payer: 59

## 2022-01-07 DIAGNOSIS — E6609 Other obesity due to excess calories: Secondary | ICD-10-CM | POA: Diagnosis not present

## 2022-01-07 DIAGNOSIS — E118 Type 2 diabetes mellitus with unspecified complications: Secondary | ICD-10-CM | POA: Diagnosis not present

## 2022-01-07 DIAGNOSIS — I10 Essential (primary) hypertension: Secondary | ICD-10-CM | POA: Diagnosis not present

## 2022-01-07 DIAGNOSIS — T7840XS Allergy, unspecified, sequela: Secondary | ICD-10-CM

## 2022-01-07 DIAGNOSIS — T7840XD Allergy, unspecified, subsequent encounter: Secondary | ICD-10-CM | POA: Diagnosis not present

## 2022-01-07 DIAGNOSIS — R001 Bradycardia, unspecified: Secondary | ICD-10-CM | POA: Diagnosis not present

## 2022-01-07 DIAGNOSIS — R197 Diarrhea, unspecified: Secondary | ICD-10-CM

## 2022-01-07 DIAGNOSIS — J40 Bronchitis, not specified as acute or chronic: Secondary | ICD-10-CM | POA: Diagnosis not present

## 2022-01-07 MED ORDER — ALBUTEROL SULFATE HFA 108 (90 BASE) MCG/ACT IN AERS
1.0000 | INHALATION_SPRAY | RESPIRATORY_TRACT | 2 refills | Status: DC | PRN
  Filled 2022-01-07: qty 6.7, 16d supply, fill #0

## 2022-01-07 MED ORDER — OLMESARTAN MEDOXOMIL-HCTZ 20-12.5 MG PO TABS
1.0000 | ORAL_TABLET | Freq: Every day | ORAL | 0 refills | Status: DC
  Filled 2022-01-07: qty 90, 90d supply, fill #0

## 2022-01-08 ENCOUNTER — Other Ambulatory Visit (HOSPITAL_COMMUNITY): Payer: Self-pay

## 2022-01-09 ENCOUNTER — Other Ambulatory Visit (HOSPITAL_COMMUNITY): Payer: Self-pay

## 2022-01-10 ENCOUNTER — Ambulatory Visit (HOSPITAL_BASED_OUTPATIENT_CLINIC_OR_DEPARTMENT_OTHER)
Admission: RE | Admit: 2022-01-10 | Discharge: 2022-01-10 | Disposition: A | Discharge status: Home / Self Care | Payer: 59 | Source: Ambulatory Visit | Attending: Nurse Practitioner | Admitting: Nurse Practitioner

## 2022-01-10 DIAGNOSIS — Z780 Asymptomatic menopausal state: Secondary | ICD-10-CM | POA: Diagnosis not present

## 2022-01-10 DIAGNOSIS — R140 Abdominal distension (gaseous): Secondary | ICD-10-CM | POA: Insufficient documentation

## 2022-01-10 DIAGNOSIS — R197 Diarrhea, unspecified: Secondary | ICD-10-CM | POA: Diagnosis not present

## 2022-01-10 DIAGNOSIS — I878 Other specified disorders of veins: Secondary | ICD-10-CM | POA: Insufficient documentation

## 2022-01-10 DIAGNOSIS — K5900 Constipation, unspecified: Secondary | ICD-10-CM | POA: Diagnosis not present

## 2022-01-10 DIAGNOSIS — R195 Other fecal abnormalities: Secondary | ICD-10-CM | POA: Diagnosis not present

## 2022-01-11 ENCOUNTER — Other Ambulatory Visit (HOSPITAL_COMMUNITY): Payer: Self-pay

## 2022-02-13 ENCOUNTER — Ambulatory Visit (HOSPITAL_BASED_OUTPATIENT_CLINIC_OR_DEPARTMENT_OTHER): Payer: 59 | Admitting: Cardiovascular Disease

## 2022-02-13 NOTE — Progress Notes (Incomplete)
Cardiology Office Note:    Date:  02/13/2022   ID:  Erica Ferguson, DOB Mar 19, 1965, MRN [id number]  PCP:  Ivonne Andrew, NP   [facility] Providers Cardiologist:  None { Click to update primary MD,subspecialty MD or APP then REFRESH:1}    Referring MD: Barbette Merino, NP   No chief complaint on file. ***  History of Present Illness:    Erica Ferguson is a 56 y.o. female with a history of HTN, Diabetes, bradycardia, and morbid obesity. Who is being seen today for the evaluation of HTN at the request of Barbette Merino, NP.   She saw her PCP 05/2021, and her blood pressure was uncontrolled. She had swelling/tightness in her hands and legs which was thought to be do to amlodipine which was discontinued. She was started on HCTZ and blood pressure remained uncontrolled at follow up. She was seen 06/2021 and her blood pressure was 150/77 but she attributed it to stress and no medication changes were made.   Today, the patient states that she  She denies any palpitations, chest pain, shortness of breath, or peripheral edema. No lightheadedness, headaches, syncope, orthopnea, or PND.  (+)  Past Medical History:  Diagnosis Date   Allergy    seasonal allergies   Angioedema    Urticaria     Past Surgical History:  Procedure Laterality Date   BREAST REDUCTION SURGERY  1989   REDUCTION MAMMAPLASTY     WISDOM TOOTH EXTRACTION Bilateral 1993    Current Medications: No outpatient medications have been marked as taking for the 02/13/22 encounter (Appointment) with Chilton Si, MD.     Allergies:   Pork allergy   Social History   Socioeconomic History   Marital status: Single    Spouse name: Not on file   Number of children: Not on file   Years of education: Not on file   Highest education level: Not on file  Occupational History   Not on file  Tobacco Use   Smoking status: Never   Smokeless tobacco: Never  Vaping Use   Vaping Use: Never used  Substance  and Sexual Activity   Alcohol use: Not Currently   Drug use: Never   Sexual activity: Not Currently  Other Topics Concern   Not on file  Social History Narrative   Not on file   Social Determinants of Health   Financial Resource Strain: Not on file  Food Insecurity: Not on file  Transportation Needs: Not on file  Physical Activity: Not on file  Stress: Not on file  Social Connections: Not on file     Family History: The patient's ***family history includes Hypertension in her mother; Kidney failure in her mother. There is no history of Colon polyps, Colon cancer, Esophageal cancer, Rectal cancer, or Stomach cancer.  ROS:   Please see the history of present illness.     *** All other systems reviewed and are negative.  EKGs/Labs/Other Studies Reviewed:    The following studies were reviewed today: ***  EKG:  EKG is personally reviewed 02/13/2022: *** Recent Labs: 07/02/2021: ALT 15; BUN 9; Creatinine, Ser 1.01; Hemoglobin 15.0; Platelets 285; Potassium 4.1; Sodium 139  Recent Lipid Panel    Component Value Date/Time   CHOL 191 07/02/2021 0911   TRIG 117 07/02/2021 0911   HDL 46 07/02/2021 0911   CHOLHDL 4.2 07/02/2021 0911   LDLCALC 124 (H) 07/02/2021 0911     Risk Assessment/Calculations:   {Does this patient  have ATRIAL FIBRILLATION?:[phone]}  No BP recorded.  {Refresh Note OR Click here to enter BP  :1}***         Physical Exam:    VS:  There were no vitals taken for this visit.    Wt Readings from Last 3 Encounters:  10/01/21 267 lb (121.1 kg)  07/01/21 282 lb 3.2 oz (128 kg)  06/06/21 277 lb 8 oz (125.9 kg)     GEN: *** Well nourished, well developed in no acute distress HEENT: Normal NECK: No JVD; No carotid bruits LYMPHATICS: No lymphadenopathy CARDIAC: ***RRR, no murmurs, rubs, gallops RESPIRATORY:  Clear to auscultation without rales, wheezing or rhonchi  ABDOMEN: Soft, non-tender, non-distended MUSCULOSKELETAL:  No edema; No deformity   SKIN: Warm and dry NEUROLOGIC:  Alert and oriented x 3 PSYCHIATRIC:  Normal affect   ASSESSMENT:    No diagnosis found. PLAN:    No problem-specific Assessment & Plan notes found for this encounter.    In order of problems listed above:  ***      {Are you ordering a CV Procedure (e.g. stress test, cath, DCCV, TEE, etc)?   Press F2        :[id number]}    Medication Adjustments/Labs and Tests Ordered: Current medicines are reviewed at length with the patient today.  Concerns regarding medicines are outlined above.  No orders of the defined types were placed in this encounter.  No orders of the defined types were placed in this encounter.   There are no Patient Instructions on file for this visit.   I,Coren O'Brien,acting as a Neurosurgeon for Chilton Si, MD.,have documented all relevant documentation on the behalf of Chilton Si, MD,as directed by  Chilton Si, MD while in the presence of Chilton Si, MD.  ***  Signed, Paulino Door  02/13/2022 8:07 AM    [facility]

## 2022-04-29 ENCOUNTER — Other Ambulatory Visit (HOSPITAL_BASED_OUTPATIENT_CLINIC_OR_DEPARTMENT_OTHER): Payer: Self-pay

## 2022-04-29 ENCOUNTER — Ambulatory Visit (INDEPENDENT_AMBULATORY_CARE_PROVIDER_SITE_OTHER): Payer: 59 | Admitting: Cardiovascular Disease

## 2022-04-29 ENCOUNTER — Other Ambulatory Visit (HOSPITAL_COMMUNITY): Payer: Self-pay

## 2022-04-29 ENCOUNTER — Encounter (HOSPITAL_BASED_OUTPATIENT_CLINIC_OR_DEPARTMENT_OTHER): Payer: Self-pay | Admitting: Cardiovascular Disease

## 2022-04-29 VITALS — BP 142/74 | HR 62 | Ht 62.0 in | Wt 285.0 lb

## 2022-04-29 DIAGNOSIS — R400 Somnolence: Secondary | ICD-10-CM | POA: Diagnosis not present

## 2022-04-29 DIAGNOSIS — R29818 Other symptoms and signs involving the nervous system: Secondary | ICD-10-CM | POA: Diagnosis not present

## 2022-04-29 DIAGNOSIS — R0609 Other forms of dyspnea: Secondary | ICD-10-CM | POA: Diagnosis not present

## 2022-04-29 DIAGNOSIS — I10 Essential (primary) hypertension: Secondary | ICD-10-CM

## 2022-04-29 DIAGNOSIS — R0683 Snoring: Secondary | ICD-10-CM | POA: Diagnosis not present

## 2022-04-29 MED ORDER — METOPROLOL TARTRATE 25 MG PO TABS
ORAL_TABLET | ORAL | 0 refills | Status: DC
  Filled 2022-04-29: qty 1, 1d supply, fill #0

## 2022-04-29 MED ORDER — OLMESARTAN MEDOXOMIL-HCTZ 40-25 MG PO TABS
1.0000 | ORAL_TABLET | Freq: Every day | ORAL | 3 refills | Status: DC
  Filled 2022-04-29: qty 90, 90d supply, fill #0

## 2022-04-29 MED ORDER — OLMESARTAN MEDOXOMIL-HCTZ 40-25 MG PO TABS
1.0000 | ORAL_TABLET | Freq: Every day | ORAL | 3 refills | Status: DC
  Filled 2022-04-29: qty 90, 90d supply, fill #0
  Filled 2022-08-27: qty 90, 90d supply, fill #1
  Filled 2022-12-04: qty 90, 90d supply, fill #2
  Filled 2023-03-13: qty 90, 90d supply, fill #3

## 2022-04-29 NOTE — Patient Instructions (Addendum)
Medication Instructions:  TAKE METOPROLOL 25 MG 2 HOURS PRIOR TO CT  INCREASE YOUR OLMESARTAN-HCT TO 40-25 MG DAILY   *If you need a refill on your cardiac medications before your next appointment, please call your pharmacy*  Lab Work: BMET/TSH IN 1 WEEK   If you have labs (blood work) drawn today and your tests are completely normal, you will receive your results only by: [place] (if you have MyChart) OR A paper copy in the mail If you have any lab test that is abnormal or we need to change your treatment, we will call you to review the results.  Testing/Procedures: Your physician has requested that you have cardiac CT. Cardiac computed tomography (CT) is a painless test that uses an x-ray machine to take clear, detailed pictures of your heart. For further information please visit [email or web address]. Please follow instruction sheet as given.  [facility] HOME SLEEP STUDY   Follow-Up:  Other Instructions   Your cardiac CT will be scheduled at one of the below locations:   [facility] [address] [place], [place] [address] [phone]  [place], [place] [address] [phone]  [facility] [place], [place] [address] [phone]  If scheduled at [facility], please arrive at the [facility] and Children's Entrance (Entrance C2) of [facility] 30 minutes prior to test start time. You can use the FREE valet parking offered at entrance C (encouraged to control the heart rate for the test)  Proceed to the [facility] Radiology Department (first floor) to check-in and test prep.  All radiology patients and guests should use entrance C2 at [facility], accessed from [facility], even though the hospital's physical address listed is [address].    If scheduled at [facility] or [facility], please arrive 15 mins early for check-in and test prep.   Please follow these instructions carefully (unless otherwise directed):  Hold all erectile dysfunction medications at least 3 days (72 hrs) prior to test. (Ie viagra, cialis, sildenafil, tadalafil, etc) We will administer nitroglycerin during this exam.   On the Night Before the Test: Be sure to Drink plenty of water. Do not consume any caffeinated/decaffeinated beverages or chocolate 12 hours prior to your test. Do not take any antihistamines 12 hours prior to your test.  On the Day of the Test: Drink plenty of water until 1 hour prior to the test. Do not eat any food 1 hour prior to test. You may take your regular medications prior to the test.  Take metoprolol (Lopressor) two hours prior to test. If you take Furosemide/Hydrochlorothiazide/Spironolactone, please HOLD on the morning of the test. FEMALES- please wear underwire-free bra if available, avoid dresses & tight clothing      After the Test: Drink plenty of water. After receiving IV contrast, you may experience a mild flushed feeling. This is normal. On occasion, you may experience a mild rash up to 24 hours after the test. This is not dangerous. If this occurs, you can take Benadryl 25 mg and increase your fluid intake. If you experience trouble breathing, this can be serious. If it is severe call 911 IMMEDIATELY. If it is mild, please call our office. If you take any of these medications: Glipizide/Metformin, Avandament, Glucavance, please do not take 48 hours after completing test unless otherwise instructed.  We  will call to schedule your test 2-4 weeks out understanding that some insurance companies will need an authorization prior to the service being performed.   For non-scheduling related questions, please contact the cardiac imaging nurse navigator should you have any questions/concerns: Marchia Bond, Cardiac  Imaging Nurse Navigator Gordy Clement, Cardiac Imaging Nurse Navigator [facility] Direct Office Dial: [phone]   For scheduling needs, including cancellations and rescheduling, please call [place], [phone].  WatchPAT?  Is a FDA cleared portable home sleep study test that uses a watch and 3 points of contact to monitor 7 different channels, including your heart rate, oxygen saturations, body position, snoring, and chest motion.  The study is easy to use from the comfort of your own home and accurately detect sleep apnea.  Before bed, you attach the chest sensor, attached the sleep apnea bracelet to your nondominant hand, and attach the finger probe.  After the study, the raw data is downloaded from the watch and scored for apnea events.   For more information: [email or web address]  Patient Testing Instructions:  Do not put battery into the device until bedtime when you are ready to begin the test. Please call the support number if you need assistance after following the instructions below: 24 hour support line- [phone] or [facility] support at [phone] (option 2)  Download the [employer] One" app through the google play store or App Store  Be sure to turn on or enable access to bluetooth in [place] on your smartphone/ device  Make sure no other bluetooth devices are on and within the vicinity of your smartphone/ device and WatchPAT watch during testing.  Make sure to leave your smart phone/ device plugged in and charging all night.  When ready for bed:  Follow the instructions step by step in the WatchPAT One App to activate the testing device. For additional instructions, including video instruction, visit the WatchPAT One video on Youtube. You can search for [place] One within Youtube (video is 4 minutes and 18 seconds) or enter: https://youtube/watch?[email or web address]_[email or web address] Please note: You will be prompted to enter a Pin to connect  via bluetooth when starting the test. The PIN will be assigned to you when you receive the test.  The device is disposable, but it recommended that you retain the device until you receive a call letting you know the study has been received and the results have been interpreted.  We will let you know if the study did not transmit to [place] properly after the test is completed. You do not need to call us to confirm the receipt of the test.  Please complete the test within 48 hours of receiving PIN.   Frequently Asked Questions:  What is Watch [place] one?  A single use fully disposable home sleep apnea testing device and will not need to be returned after completion.  What are the requirements to use WatchPAT one?  The be able to have a successful watchpat one sleep study, you should have your Watch pat one device, your smart phone, watch pat one app, your PIN number and Internet access What type of phone do I need?  You should have a smart phone that uses Android 5.1 and above or any Iphone with IOS 10 and above How can I download the WatchPAT one app?  Based on your device type search for WatchPAT one app either in google play for android devices or APP store for Iphone's Where will I get my PIN for the study?  Your PIN will be provided by your physician's office. It is used for authentication and if you lose/forget your PIN, please reach out to your providers office.  I do not have Internet at home. Can I do WatchPAT one study?  WatchPAT One needs Internet connection throughout the night to be able to transmit the sleep data. You can use your home/local internet or your cellular's data package. However, it is always recommended to use home/local Internet. It is estimated that between 20MB-30MB will be used with each study.However, the application will be looking for 80MB space in the phone to start the study.  What happens if I lose internet or bluetooth connection?  During the internet disconnection,  your phone will not be able to transmit the sleep data. All the data, will be stored in your phone. As soon as the internet connection is back on, the phone will being sending the sleep data. During the bluetooth disconnection, WatchPAT one will not be able to to send the sleep data to your phone. Data will be kept in the [facility] one until two devices have bluetooth connection back on. As soon as the connection is back on, WatchPAT one will send the sleep data to the phone.  How long do I need to wear the WatchPAT one?  After you start the study, you should wear the device at least 6 hours.  How far should I keep my phone from the device?  During the night, your phone should be within 15 feet.  What happens if I leave the room for restroom or other reasons?  Leaving the room for any reason will not cause any problem. As soon as your get back to the room, both devices will reconnect and will continue to send the sleep data. Can I use my phone during the sleep study?  Yes, you can use your phone as usual during the study. But it is recommended to put your watchpat one on when you are ready to go to bed.  How will I get my study results?  A soon as you completed your study, your sleep data will be sent to the provider. They will then share the results with you when they are ready.   Cardiac CT Angiogram A cardiac CT angiogram is a procedure to look at the heart and the area around the heart. It may be done to help find the cause of chest pains or other symptoms of heart disease. During this procedure, a substance called contrast dye is injected into the blood vessels in the area to be checked. A large X-ray machine, called a CT scanner, then takes detailed pictures of the heart and the surrounding area. The procedure is also sometimes called a coronary CT angiogram, coronary artery scanning, or CTA. A cardiac CT angiogram allows the health care provider to see how well blood is flowing to and from the  heart. The health care provider will be able to see if there are any problems, such as: Blockage or narrowing of the coronary arteries in the heart. Fluid around the heart. Signs of weakness or disease in the muscles, valves, and tissues of the heart. Tell a health care provider about: Any allergies you have. This is especially important if you have had a previous allergic reaction to contrast dye. All medicines you are taking, including vitamins, herbs, eye drops, creams, and over-the-counter medicines. Any blood disorders you have. Any surgeries you have had. Any medical conditions you have. Whether you are pregnant  or may be pregnant. Any anxiety disorders, chronic pain, or other conditions you have that may increase your stress or prevent you from lying still. What are the risks? Generally, this is a safe procedure. However, problems may occur, including: Bleeding. Infection. Allergic reactions to medicines or dyes. Damage to other structures or organs. Kidney damage from the contrast dye that is used. Increased risk of cancer from radiation exposure. This risk is low. Talk with your health care provider about: The risks and benefits of testing. How you can receive the lowest dose of radiation. What happens before the procedure? Wear comfortable clothing and remove any jewelry, glasses, dentures, and hearing aids. Follow instructions from your health care provider about eating and drinking. This may include: For 12 hours before the procedure -- avoid caffeine. This includes tea, coffee, soda, energy drinks, and diet pills. Drink plenty of water or other fluids that do not have caffeine in them. Being well hydrated can prevent complications. For 4-6 hours before the procedure -- stop eating and drinking. The contrast dye can cause nausea, but this is less likely if your stomach is empty. Ask your health care provider about changing or stopping your regular medicines. This is especially  important if you are taking diabetes medicines, blood thinners, or medicines to treat problems with erections (erectile dysfunction). What happens during the procedure?  Hair on your chest may need to be removed so that small sticky patches called electrodes can be placed on your chest. These will transmit information that helps to monitor your heart during the procedure. An IV will be inserted into one of your veins. You might be given a medicine to control your heart rate during the procedure. This will help to ensure that good images are obtained. You will be asked to lie on an exam table. This table will slide in and out of the CT machine during the procedure. Contrast dye will be injected into the IV. You might feel warm, or you may get a metallic taste in your mouth. You will be given a medicine called nitroglycerin. This will relax or dilate the arteries in your heart. The table that you are lying on will move into the CT machine tunnel for the scan. The person running the machine will give you instructions while the scans are being done. You may be asked to: Keep your arms above your head. Hold your breath. Stay very still, even if the table is moving. When the scanning is complete, you will be moved out of the machine. The IV will be removed. The procedure may vary among health care providers and hospitals. What can I expect after the procedure? After your procedure, it is common to have: A metallic taste in your mouth from the contrast dye. A feeling of warmth. A headache from the nitroglycerin. Follow these instructions at home: Take over-the-counter and prescription medicines only as told by your health care provider. If you are told, drink enough fluid to keep your urine pale yellow. This will help to flush the contrast dye out of your body. Most people can return to their normal activities right after the procedure. Ask your health care provider what activities are safe for  you. It is up to you to get the results of your procedure. Ask your health care provider, or the department that is doing the procedure, when your results will be ready. Keep all follow-up visits as told by your health care provider. This is important. Contact a health care provider  if: You have any symptoms of allergy to the contrast dye. These include: Shortness of breath. Rash or hives. A racing heartbeat. Summary A cardiac CT angiogram is a procedure to look at the heart and the area around the heart. It may be done to help find the cause of chest pains or other symptoms of heart disease. During this procedure, a large X-ray machine, called a CT scanner, takes detailed pictures of the heart and the surrounding area after a contrast dye has been injected into blood vessels in the area. Ask your health care provider about changing or stopping your regular medicines before the procedure. This is especially important if you are taking diabetes medicines, blood thinners, or medicines to treat erectile dysfunction. If you are told, drink enough fluid to keep your urine pale yellow. This will help to flush the contrast dye out of your body. This information is not intended to replace advice given to you by your health care provider. Make sure you discuss any questions you have with your health care provider. Document Revised: 06/13/2021 Document Reviewed: 10/20/2018 Elsevier Patient Education  [place].

## 2022-04-29 NOTE — Progress Notes (Signed)
Cardiology Office Note:    Date:  05/23/2022   ID:  Erica Ferguson, DOB [birth year], MRN [id number]  PCP:  Default, Provider, MD   [facility] Providers Cardiologist:  None     Referring MD: Vevelyn Francois, NP   No chief complaint on file.   History of Present Illness:    Erica Ferguson is a 57 y.o. female with a hx of hypertension, morbid obesity, and bradycardia, here for the evaluation of bradycardia.  Lately her heart rate has been consistently bradycardic at her clinic visits. She states her heart rate is higher today because she was rushing to get to this appointment. Every once in a while she feels lightheaded; she has not been able to monitor her heart rate at these times. She does not monitor her BP at home. Her home cuff is reportedly inaccurate. She has been on antihypertensives for about 3 months. Generally she is concerned that she is feeling ill and fatigued. She has struggled with feeling this way for the past 2 years. At work she is sedentary, sitting at a desk. She does not formally exercise. Usually she feels out of breath with stairs or minimal activity, which has been ongoing for about a year. While walking she experiences RLE pain attributable to a prior heel injury. She also had a fall that exacerbated this injury. At times there is mild swelling in her bilateral feet; not severe enough that she is unable to put on her shoes. When going to bed she notes that her feet may appear erythematous. Her erythema and swelling usually improve by the next morning. In the past month, she complains of coughing associated with lying down. She feels congested as well, but denies any drainage issues. She endorses occasional acid reflux which hasn't been severe. Limiting herself to not eating past 8 PM helps manage this significantly. If she is ill, she will snore. In the mornings she usually wakes up rested, but in the middle of the day she will feel more sleepy. She denies any  palpitations, chest pain, headaches, syncope, or PND.   Past Medical History:  Diagnosis Date   Allergy    seasonal allergies   Angioedema    Exertional dyspnea 05/23/2022   Urticaria     Past Surgical History:  Procedure Laterality Date   BREAST REDUCTION SURGERY  1989   REDUCTION MAMMAPLASTY     WISDOM TOOTH EXTRACTION Bilateral 1993    Current Medications: Current Meds  Medication Sig   albuterol (VENTOLIN HFA) 108 (90 Base) MCG/ACT inhaler Inhale 1-2 puffs into the lungs every 4-6 hours as needed.   EPINEPHrine 0.3 mg/0.3 mL IJ SOAJ injection For acute onset of lip swelling and airway obstruction, inject 1 pen into thigh muscle, call 911 and note time of first injection.  Wait 5 minutes.  If symptoms have not completely resolved, give second injection in other thigh muscle and note the time of second injection. (Patient taking differently: For acute onset of lip swelling and airway obstruction, inject 1 pen into thigh muscle, call 911 and note time of first injection.  Wait 5 minutes.  If symptoms have not completely resolved, give second injection in other thigh muscle and note the time of second injection.)   fluticasone (FLONASE) 50 MCG/ACT nasal spray Place 2 sprays into both nostrils daily.   [DISCONTINUED] metoprolol tartrate (LOPRESSOR) 25 MG tablet TAKE 1 TABLET 2 HOURS PRIOR TO CT   [DISCONTINUED] olmesartan-hydrochlorothiazide (BENICAR HCT) 20-12.5 MG tablet Take 1  tablet by mouth daily.   [DISCONTINUED] olmesartan-hydrochlorothiazide (BENICAR HCT) 40-25 MG tablet Take 1 tablet by mouth daily.     Allergies:   Pork allergy   Social History   Socioeconomic History   Marital status: Single    Spouse name: Not on file   Number of children: Not on file   Years of education: Not on file   Highest education level: Not on file  Occupational History   Not on file  Tobacco Use   Smoking status: Never   Smokeless tobacco: Never  Vaping Use   Vaping Use: Never used   Substance and Sexual Activity   Alcohol use: Not Currently   Drug use: Never   Sexual activity: Not Currently  Other Topics Concern   Not on file  Social History Narrative   Not on file   Social Determinants of Health   Financial Resource Strain: Not on file  Food Insecurity: No Food Insecurity (04/29/2022)   Hunger Vital Sign    Worried About Running Out of Food in the Last Year: Never true    Ran Out of Food in the Last Year: Never true  Transportation Needs: No Transportation Needs (04/29/2022)   PRAPARE - Hydrologist (Medical): No    Lack of Transportation (Non-Medical): No  Physical Activity: Inactive (04/29/2022)   Exercise Vital Sign    Days of Exercise per Week: 0 days    Minutes of Exercise per Session: 0 min  Stress: Not on file  Social Connections: Not on file     Family History: The patient's family history includes Heart attack in her mother; Hypertension in her father and mother; Kidney failure in her mother; Stroke in her maternal grandmother. There is no history of Colon polyps, Colon cancer, Esophageal cancer, Rectal cancer, or Stomach cancer.  ROS:   Please see the history of present illness.    (+) Lightheadedness (+) shortness of breath (+) RLE pain (+) BLE swelling in feet (+) Cough with lying down (+) Congestion (+) Fatigue (+) Snoring (+) Daytime somnolence (+) Acid reflux All other systems reviewed and are negative.  EKGs/Labs/Other Studies Reviewed:    The following studies were reviewed today:  TTE  06/23/2016  ([place]): CONCLUSION:  1. Mild borderline left atrial enlargement.  2. Concentric left ventricular hypertrophy.  3. Normal left ventricular systolic function.  4. Mild tricuspid insufficiency.    EKG:   EKG is personally reviewed. 04/29/2022: Sinus rhythm. Rate 62 bpm.  Recent Labs: 07/02/2021: ALT 15; BUN 9; Creatinine, Ser 1.01; Hemoglobin 15.0; Platelets 285; Potassium 4.1; Sodium  139   Recent Lipid Panel    Component Value Date/Time   CHOL 191 07/02/2021 0911   TRIG 117 07/02/2021 0911   HDL 46 07/02/2021 0911   CHOLHDL 4.2 07/02/2021 0911   LDLCALC 124 (H) 07/02/2021 0911     Risk Assessment/Calculations:      STOP-Bang Score:  6       Physical Exam:    Wt Readings from Last 3 Encounters:  04/29/22 285 lb (129.3 kg)  10/01/21 267 lb (121.1 kg)  07/01/21 282 lb 3.2 oz (128 kg)     VS:  BP (!) 142/74 (BP Location: Left Arm, Patient Position: Sitting, Cuff Size: Large)   Pulse 62   Ht 5\' 2"  (1.575 m)   Wt 285 lb (129.3 kg)   BMI 52.13 kg/m  , BMI Body mass index is 52.13 kg/m. GENERAL:  Well appearing HEENT:  Pupils equal round and reactive, fundi not visualized, oral mucosa unremarkable NECK:  No jugular venous distention, waveform within normal limits, carotid upstroke brisk and symmetric, no bruits, no thyromegaly LUNGS:  Clear to auscultation bilaterally HEART:  RRR.  PMI not displaced or sustained,S1 and S2 within normal limits, no S3, no S4, no clicks, no rubs, no murmurs ABD:  Flat, positive bowel sounds normal in frequency in pitch, no bruits, no rebound, no guarding, no midline pulsatile mass, no hepatomegaly, no splenomegaly EXT:  2 plus pulses throughout, no edema, no cyanosis no clubbing SKIN:  No rashes no nodules NEURO:  Cranial nerves II through XII grossly intact, motor grossly intact throughout PSYCH:  Cognitively intact, oriented to person place and time   ASSESSMENT:    1. DOE (dyspnea on exertion)   2. Snoring   3. Daytime somnolence   4. Essential hypertension   5. Primary hypertension   6. Suspected sleep apnea   7. Exertional dyspnea    PLAN:    Primary hypertension Blood pressure is uncontrolled.  We will increase olmesartan/HCTZ to 40/25mg  daily.  Check BMP in one week.  Avoid nodal agents given bradycardia.   Suspected sleep apnea Patient has snoring and daytime somnolence.  Will check an [facility] sleep  study.  Exertional dyspnea Patient reports exertional dyspnea.  Will get a coronary CT-A to assess for CAD.  Disposition: FU with APP in 2-3 months.  Medication Adjustments/Labs and Tests Ordered: Current medicines are reviewed at length with the patient today.  Concerns regarding medicines are outlined above.   Orders Placed This Encounter  Procedures   CT CORONARY MORPH W/CTA COR W/SCORE W/CA W/CM &/OR WO/CM   TSH   Basic metabolic panel   EKG [id number]   [facility] Sleep Study   Meds ordered this encounter  Medications   DISCONTD: olmesartan-hydrochlorothiazide (BENICAR HCT) 40-25 MG tablet    Sig: Take 1 tablet by mouth daily.    Dispense:  90 tablet    Refill:  3    D/C 20-12.5 MG RX   DISCONTD: metoprolol tartrate (LOPRESSOR) 25 MG tablet    Sig: TAKE 1 TABLET 2 HOURS PRIOR TO CT    Dispense:  1 tablet    Refill:  0   metoprolol tartrate (LOPRESSOR) 25 MG tablet    Sig: TAKE 1 TABLET BY MOUTH 2 HOURS PRIOR TO CT    Dispense:  1 tablet    Refill:  0   olmesartan-hydrochlorothiazide (BENICAR HCT) 40-25 MG tablet    Sig: Take 1 tablet by mouth daily.    Dispense:  90 tablet    Refill:  3    D/C 20-12.5 MG RX   Patient Instructions  Medication Instructions:  TAKE METOPROLOL 25 MG 2 HOURS PRIOR TO CT  INCREASE YOUR OLMESARTAN-HCT TO 40-25 MG DAILY   *If you need a refill on your cardiac medications before your next appointment, please call your pharmacy*  Lab Work: BMET/TSH IN 1 WEEK   If you have labs (blood work) drawn today and your tests are completely normal, you will receive your results only by: MyChart Message (if you have MyChart) OR A paper copy in the mail If you have any lab test that is abnormal or we need to change your treatment, we will call you to review the results.  Testing/Procedures: Your physician has requested that you have cardiac CT. Cardiac computed tomography (CT) is a painless test that uses an x-ray machine to take clear, detailed  pictures of  your heart. For further information please visit [email or web address]. Please follow instruction sheet as given.  [facility] HOME SLEEP STUDY   Follow-Up:  Other Instructions   Your cardiac CT will be scheduled at one of the below locations:   [facility] [address] [place], [place] [address] [phone]  [place], [place] [address] [phone]  [facility] [place], [place] [address] [phone]  If scheduled at [facility], please arrive at the [facility] and Children's Entrance (Entrance C2) of [facility] 30 minutes prior to test start time. You can use the FREE valet parking offered at entrance C (encouraged to control the heart rate for the test)  Proceed to the [facility] Radiology Department (first floor) to check-in and test prep.  All radiology patients and guests should use entrance C2 at [facility], accessed from [facility], even though the hospital's physical address listed is [address].    If scheduled at [facility] or [facility], please arrive 15 mins early for check-in and test prep.   Please follow these instructions carefully (unless otherwise directed):  Hold all erectile dysfunction medications at least 3 days (72 hrs) prior to test. (Ie viagra, cialis, sildenafil, tadalafil, etc) We will administer nitroglycerin during this exam.   On the Night Before the Test: Be sure to Drink plenty of water. Do not consume any caffeinated/decaffeinated beverages or chocolate 12 hours prior to your test. Do not take any antihistamines 12 hours prior to your test.  On the Day of the Test: Drink plenty of water until 1 hour prior to the test. Do not eat any food 1 hour prior to test. You may take your regular medications  prior to the test.  Take metoprolol (Lopressor) two hours prior to test. If you take Furosemide/Hydrochlorothiazide/Spironolactone, please HOLD on the morning of the test. FEMALES- please wear underwire-free bra if available, avoid dresses & tight clothing      After the Test: Drink plenty of water. After receiving IV contrast, you may experience a mild flushed feeling. This is normal. On occasion, you may experience a mild rash up to 24 hours after the test. This is not dangerous. If this occurs, you can take Benadryl 25 mg and increase your fluid intake. If you experience trouble breathing, this can be serious. If it is severe call 911 IMMEDIATELY. If it is mild, please call our office. If you take any of these medications: Glipizide/Metformin, Avandament, Glucavance, please do not take 48 hours after completing test unless otherwise instructed.  We will call to schedule your test 2-4 weeks out understanding that some insurance companies will need an authorization prior to the service being performed.   For non-scheduling related questions, please contact the cardiac imaging nurse navigator should you have any questions/concerns: Marchia Bond, Cardiac Imaging Nurse Navigator Gordy Clement, Cardiac Imaging Nurse Navigator [facility] Direct Office Dial: [phone]   For scheduling needs, including cancellations and rescheduling, please call [place], [phone].  WatchPAT?  Is a FDA cleared portable home sleep study test that uses a watch and 3 points of contact to monitor 7 different channels, including your heart rate, oxygen saturations, body position, snoring, and chest motion.  The study is easy to use from the comfort of your own home and accurately detect sleep apnea.  Before  bed, you attach the chest sensor, attached the sleep apnea bracelet to your nondominant hand, and attach the finger probe.  After the study, the raw data is downloaded from the  watch and scored for apnea events.   For more information: [email or web address]  Patient Testing Instructions:  Do not put battery into the device until bedtime when you are ready to begin the test. Please call the support number if you need assistance after following the instructions below: 24 hour support line- [phone] or [facility] support at [phone] (option 2)  Download the [employer] AmericanWatchPAT One" app through the google play store or App Store  Be sure to turn on or enable access to bluetooth in settlings on your smartphone/ device  Make sure no other bluetooth devices are on and within the vicinity of your smartphone/ device and WatchPAT watch during testing.  Make sure to leave your smart phone/ device plugged in and charging all night.  When ready for bed:  Follow the instructions step by step in the WatchPAT One App to activate the testing device. For additional instructions, including video instruction, visit the WatchPAT One video on Youtube. You can search for [place] One within Youtube (video is 4 minutes and 18 seconds) or enter: https://youtube/watch?[email or web address]_vbiwxE Please note: You will be prompted to enter a Pin to connect via bluetooth when starting the test. The PIN will be assigned to you when you receive the test.  The device is disposable, but it recommended that you retain the device until you receive a call letting you know the study has been received and the results have been interpreted.  We will let you know if the study did not transmit to [place] properly after the test is completed. You do not need to call us to confirm the receipt of the test.  Please complete the test within 48 hours of receiving PIN.   Frequently Asked Questions:  What is Watch [place] one?  A single use fully disposable home sleep apnea testing device and will not need to be returned after completion.  What are the requirements to use WatchPAT one?  The be able to have a successful  watchpat one sleep study, you should have your Watch pat one device, your smart phone, watch pat one app, your PIN number and Internet access What type of phone do I need?  You should have a smart phone that uses Android 5.1 and above or any Iphone with IOS 10 and above How can I download the WatchPAT one app?  Based on your device type search for WatchPAT one app either in google play for android devices or APP store for Iphone's Where will I get my PIN for the study?  Your PIN will be provided by your physician's office. It is used for authentication and if you lose/forget your PIN, please reach out to your providers office.  I do not have Internet at home. Can I do WatchPAT one study?  WatchPAT One needs Internet connection throughout the night to be able to transmit the sleep data. You can use your home/local internet or your cellular's data package. However, it is always recommended to use home/local Internet. It is estimated that between 20MB-30MB will be used with each study.However, the application will be looking for 80MB space in the phone to start the study.  What happens if I lose internet or bluetooth connection?  During the internet disconnection, your phone will not be able to transmit the sleep data. All the data, will be  stored in your phone. As soon as the internet connection is back on, the phone will being sending the sleep data. During the bluetooth disconnection, WatchPAT one will not be able to to send the sleep data to your phone. Data will be kept in the [facility] one until two devices have bluetooth connection back on. As soon as the connection is back on, WatchPAT one will send the sleep data to the phone.  How long do I need to wear the WatchPAT one?  After you start the study, you should wear the device at least 6 hours.  How far should I keep my phone from the device?  During the night, your phone should be within 15 feet.  What happens if I leave the room for restroom or  other reasons?  Leaving the room for any reason will not cause any problem. As soon as your get back to the room, both devices will reconnect and will continue to send the sleep data. Can I use my phone during the sleep study?  Yes, you can use your phone as usual during the study. But it is recommended to put your watchpat one on when you are ready to go to bed.  How will I get my study results?  A soon as you completed your study, your sleep data will be sent to the provider. They will then share the results with you when they are ready.   Cardiac CT Angiogram A cardiac CT angiogram is a procedure to look at the heart and the area around the heart. It may be done to help find the cause of chest pains or other symptoms of heart disease. During this procedure, a substance called contrast dye is injected into the blood vessels in the area to be checked. A large X-ray machine, called a CT scanner, then takes detailed pictures of the heart and the surrounding area. The procedure is also sometimes called a coronary CT angiogram, coronary artery scanning, or CTA. A cardiac CT angiogram allows the health care provider to see how well blood is flowing to and from the heart. The health care provider will be able to see if there are any problems, such as: Blockage or narrowing of the coronary arteries in the heart. Fluid around the heart. Signs of weakness or disease in the muscles, valves, and tissues of the heart. Tell a health care provider about: Any allergies you have. This is especially important if you have had a previous allergic reaction to contrast dye. All medicines you are taking, including vitamins, herbs, eye drops, creams, and over-the-counter medicines. Any blood disorders you have. Any surgeries you have had. Any medical conditions you have. Whether you are pregnant or may be pregnant. Any anxiety disorders, chronic pain, or other conditions you have that may increase your stress or  prevent you from lying still. What are the risks? Generally, this is a safe procedure. However, problems may occur, including: Bleeding. Infection. Allergic reactions to medicines or dyes. Damage to other structures or organs. Kidney damage from the contrast dye that is used. Increased risk of cancer from radiation exposure. This risk is low. Talk with your health care provider about: The risks and benefits of testing. How you can receive the lowest dose of radiation. What happens before the procedure? Wear comfortable clothing and remove any jewelry, glasses, dentures, and hearing aids. Follow instructions from your health care provider about eating and drinking. This may include: For 12 hours before the procedure -- avoid caffeine.  This includes tea, coffee, soda, energy drinks, and diet pills. Drink plenty of water or other fluids that do not have caffeine in them. Being well hydrated can prevent complications. For 4-6 hours before the procedure -- stop eating and drinking. The contrast dye can cause nausea, but this is less likely if your stomach is empty. Ask your health care provider about changing or stopping your regular medicines. This is especially important if you are taking diabetes medicines, blood thinners, or medicines to treat problems with erections (erectile dysfunction). What happens during the procedure?  Hair on your chest may need to be removed so that small sticky patches called electrodes can be placed on your chest. These will transmit information that helps to monitor your heart during the procedure. An IV will be inserted into one of your veins. You might be given a medicine to control your heart rate during the procedure. This will help to ensure that good images are obtained. You will be asked to lie on an exam table. This table will slide in and out of the CT machine during the procedure. Contrast dye will be injected into the IV. You might feel warm, or you may  get a metallic taste in your mouth. You will be given a medicine called nitroglycerin. This will relax or dilate the arteries in your heart. The table that you are lying on will move into the CT machine tunnel for the scan. The person running the machine will give you instructions while the scans are being done. You may be asked to: Keep your arms above your head. Hold your breath. Stay very still, even if the table is moving. When the scanning is complete, you will be moved out of the machine. The IV will be removed. The procedure may vary among health care providers and hospitals. What can I expect after the procedure? After your procedure, it is common to have: A metallic taste in your mouth from the contrast dye. A feeling of warmth. A headache from the nitroglycerin. Follow these instructions at home: Take over-the-counter and prescription medicines only as told by your health care provider. If you are told, drink enough fluid to keep your urine pale yellow. This will help to flush the contrast dye out of your body. Most people can return to their normal activities right after the procedure. Ask your health care provider what activities are safe for you. It is up to you to get the results of your procedure. Ask your health care provider, or the department that is doing the procedure, when your results will be ready. Keep all follow-up visits as told by your health care provider. This is important. Contact a health care provider if: You have any symptoms of allergy to the contrast dye. These include: Shortness of breath. Rash or hives. A racing heartbeat. Summary A cardiac CT angiogram is a procedure to look at the heart and the area around the heart. It may be done to help find the cause of chest pains or other symptoms of heart disease. During this procedure, a large X-ray machine, called a CT scanner, takes detailed pictures of the heart and the surrounding area after a contrast  dye has been injected into blood vessels in the area. Ask your health care provider about changing or stopping your regular medicines before the procedure. This is especially important if you are taking diabetes medicines, blood thinners, or medicines to treat erectile dysfunction. If you are told, drink enough fluid to keep your  urine pale yellow. This will help to flush the contrast dye out of your body. This information is not intended to replace advice given to you by your health care provider. Make sure you discuss any questions you have with your health care provider. Document Revised: 06/13/2021 Document Reviewed: 10/20/2018 Elsevier Patient Education  [place].    I,Mathew Stumpf,acting as a Education administrator for Skeet Latch, MD.,have documented all relevant documentation on the behalf of Skeet Latch, MD,as directed by  Skeet Latch, MD while in the presence of Skeet Latch, MD.  I, Bruce Oval Linsey, MD have reviewed all documentation for this visit.  The documentation of the exam, diagnosis, procedures, and orders on 05/23/2022 are all accurate and complete.   Signed, Skeet Latch, MD  05/23/2022 7:27 PM    [place]

## 2022-05-02 ENCOUNTER — Telehealth: Payer: Self-pay | Admitting: *Deleted

## 2022-05-02 ENCOUNTER — Encounter (HOSPITAL_BASED_OUTPATIENT_CLINIC_OR_DEPARTMENT_OTHER): Payer: Self-pay | Admitting: Cardiovascular Disease

## 2022-05-02 NOTE — Telephone Encounter (Signed)
Secure chat message sent to Alvina Filbert ok to activate itamar device.

## 2022-05-02 NOTE — Telephone Encounter (Signed)
Called and advised patient ok to activate

## 2022-05-05 ENCOUNTER — Telehealth: Payer: Self-pay | Admitting: *Deleted

## 2022-05-08 ENCOUNTER — Encounter (INDEPENDENT_AMBULATORY_CARE_PROVIDER_SITE_OTHER): Payer: 59 | Admitting: Cardiology

## 2022-05-08 ENCOUNTER — Telehealth (HOSPITAL_COMMUNITY): Payer: Self-pay | Admitting: *Deleted

## 2022-05-08 ENCOUNTER — Telehealth (HOSPITAL_COMMUNITY): Payer: Self-pay | Admitting: Emergency Medicine

## 2022-05-08 DIAGNOSIS — G4733 Obstructive sleep apnea (adult) (pediatric): Secondary | ICD-10-CM | POA: Diagnosis not present

## 2022-05-08 NOTE — Telephone Encounter (Signed)
Pt states she cannot afford test at this time but will wait for phone call from insurance before canceling the appt.  Marchia Bond RN Navigator Cardiac Imaging [facility] [phone] Office  [phone] Cell

## 2022-05-08 NOTE — Telephone Encounter (Signed)
Patient calling to cancel her CCTA due to cost. She would like to discuss with her MD about a cheaper alternative testing. CCTA appt canceled.  Gordy Clement RN Navigator Cardiac Imaging [facility] [phone] Office [phone] Cell

## 2022-05-09 ENCOUNTER — Ambulatory Visit (HOSPITAL_COMMUNITY)
Admission: RE | Admit: 2022-05-09 | Payer: 59 | Source: Ambulatory Visit | Attending: Cardiovascular Disease | Admitting: Cardiovascular Disease

## 2022-05-15 NOTE — Telephone Encounter (Signed)
error 

## 2022-05-23 ENCOUNTER — Encounter (HOSPITAL_BASED_OUTPATIENT_CLINIC_OR_DEPARTMENT_OTHER): Payer: Self-pay | Admitting: Cardiovascular Disease

## 2022-05-23 DIAGNOSIS — R0609 Other forms of dyspnea: Secondary | ICD-10-CM | POA: Insufficient documentation

## 2022-05-23 HISTORY — DX: Other forms of dyspnea: R06.09

## 2022-05-23 NOTE — Assessment & Plan Note (Signed)
Blood pressure is uncontrolled.  We will increase olmesartan/HCTZ to 40/25mg  daily.  Check BMP in one week.  Avoid nodal agents given bradycardia.

## 2022-05-23 NOTE — Assessment & Plan Note (Signed)
Patient reports exertional dyspnea.  Will get a coronary CT-A to assess for CAD.

## 2022-05-23 NOTE — Assessment & Plan Note (Signed)
Patient has snoring and daytime somnolence.  Will check an Itamar sleep study.

## 2022-05-26 ENCOUNTER — Ambulatory Visit: Payer: 59 | Attending: Cardiovascular Disease

## 2022-05-26 ENCOUNTER — Telehealth (HOSPITAL_BASED_OUTPATIENT_CLINIC_OR_DEPARTMENT_OTHER): Payer: Self-pay | Admitting: *Deleted

## 2022-05-26 DIAGNOSIS — R400 Somnolence: Secondary | ICD-10-CM

## 2022-05-26 DIAGNOSIS — R0683 Snoring: Secondary | ICD-10-CM

## 2022-05-26 DIAGNOSIS — I10 Essential (primary) hypertension: Secondary | ICD-10-CM

## 2022-05-26 DIAGNOSIS — R0609 Other forms of dyspnea: Secondary | ICD-10-CM

## 2022-05-26 NOTE — Telephone Encounter (Signed)
-----   Message from Skeet Latch, MD sent at 05/23/2022  7:29 PM EDT ----- Thanks.  Rip Harbour, can we help her get an ETT?  TCR ----- Message ----- From: Eli Hose, RN Sent: 05/08/2022   4:33 PM EDT To: Skeet Latch, MD; Earvin Hansen, LPN  FYI.  [place]

## 2022-05-26 NOTE — Telephone Encounter (Addendum)
-----   Message from Skeet Latch, MD sent at 05/23/2022  7:29 PM EDT ----- Thanks.  Rip Harbour, can we help her get an ETT?  TCR ----- Message ----- From: Eli Hose, RN Sent: 05/08/2022   4:33 PM EDT To: Skeet Latch, MD; Earvin Hansen, LPN  FYI.  [place]   Patient calling to cancel her CCTA due to cost. She would like to discuss with her MD about a cheaper alternative testing. CCTA appt canceled.

## 2022-05-26 NOTE — Procedures (Signed)
Patient Information Study Date: 05/08/2022 Patient Name: Erica Ferguson Patient ID: [id number] Birth Date: 1965/06/19 Age: 57 Gender: Female BMI: 52.3 (W=284 lb, H=5' 2'') Referring Physician: Skeet Latch, MD  TEST DESCRIPTION:  Home sleep apnea testing was completed using the WatchPat, a Type 1 device, utilizing peripheral arterial tonometry (PAT), chest movement, actigraphy, pulse oximetry, pulse rate, body position and snore.  AHI was calculated with apnea and hypopnea using valid sleep time as the denominator. RDI includes apneas, hypopneas, and RERAs.  The data acquired and the scoring of sleep and all associated events were performed in accordance with the recommended standards and specifications as outlined in the AASM Manual for the Scoring of Sleep and Associated Events 2.2.0 (2015).  FINDINGS:  1.  Moderate Obstructive Sleep Apnea with AHI 17.1/hr.   2.  Normal Central Sleep Apnea with pAHIc 0.2/hr.  3.  Oxygen desaturations as low as 83%.  4.  Moderate snoring was present. O2 sats were < 88% for 2.9 min.  5.  Total sleep time was 4 hrs and 58 min.  6.  27% of total sleep time was spent in REM sleep.   7.  Normal sleep onset latency at 22 min  8.  Shortened REM sleep onset latency at 63 min.   9.  Total awakenings were 11.  10. Arrhythmia detection:  None.  DIAGNOSIS:   Moderate Obstructive Sleep Apnea (G47.33)  RECOMMENDATIONS:   1.  Clinical correlation of these findings is necessary.  The decision to treat obstructive sleep apnea (OSA) is usually based on the presence of apnea symptoms or the presence of associated medical conditions such as Hypertension, Congestive Heart Failure, Atrial Fibrillation or Obesity.  The most common symptoms of OSA are snoring, gasping for breath while sleeping, daytime sleepiness and fatigue.   2.  Initiating apnea therapy is recommended given the presence of symptoms and/or associated conditions. Recommend proceeding with one of the  following:     a.  Auto-CPAP therapy with a pressure range of 5-20cm H2O.     b.  An oral appliance (OA) that can be obtained from certain dentists with expertise in sleep medicine.  These are primarily of use in non-obese patients with mild and moderate disease.     c.  An ENT consultation which may be useful to look for specific causes of obstruction and possible treatment options.     d.  If patient is intolerant to PAP therapy, consider referral to ENT for evaluation for hypoglossal nerve stimulator.   3.  Close follow-up is necessary to ensure success with CPAP or oral appliance therapy for maximum benefit.  4.  A follow-up oximetry study on CPAP is recommended to assess the adequacy of therapy and determine the need for supplemental oxygen or the potential need for Bi-level therapy.  An arterial blood gas to determine the adequacy of baseline ventilation and oxygenation should also be considered.  5.  Healthy sleep recommendations include:  adequate nightly sleep (normal 7-9 hrs/night), avoidance of caffeine after noon and alcohol near bedtime, and maintaining a sleep environment that is cool, dark and quiet.  6.  Weight loss for overweight patients is recommended.  Even modest amounts of weight loss can significantly improve the severity of sleep apnea.  7.  Snoring recommendations include:  weight loss where appropriate, side sleeping, and avoidance of alcohol before bed.  8.  Operation of motor vehicle should not be performed when sleepy.  Signature: Fransico Him, MD; [facility]; [place], [place] Board of  Sleep Medicine Electronically Signed: 05/26/2022

## 2022-05-26 NOTE — Procedures (Deleted)
    NAME: KETARA MAESTRE DATE OF BIRTH:  1965-06-08 MEDICAL RECORD NUMBER [id number]  LOCATION: [facility]  PHYSICIAN: Jennavieve Arrick  DATE OF STUDY: 05/08/2022  SLEEP STUDY TYPE: Out of Center Sleep Test                REFERRING PHYSICIAN: No ref. provider found  INDICATION FOR STUDY: ***  EPWORTH SLEEPINESS SCORE:   HEIGHT:    WEIGHT:      There is no height or weight on file to calculate BMI.  NECK SIZE:   in.  MEDICATIONS: ***  IMPRESSION:  ***    RECOMMENDATION:  ***   Kellis Topete Diplomate, American Board of Sleep Medicine  ELECTRONICALLY SIGNED ON:  05/26/2022, 7:43 AM [facility] PH: [phone]   FX: [phone] [place]

## 2022-05-26 NOTE — Telephone Encounter (Signed)
Spoke with patient regarding testing, she was asking about sleep study, message sent to sleep pool to follow up

## 2022-05-26 NOTE — Telephone Encounter (Signed)
Advised patient, verbalized understanding Message sent to [facility] to reach out an schedule

## 2022-05-27 ENCOUNTER — Encounter: Payer: Self-pay | Admitting: Radiology

## 2022-05-28 ENCOUNTER — Telehealth: Payer: Self-pay | Admitting: *Deleted

## 2022-05-28 NOTE — Telephone Encounter (Signed)
Patient scheduled 3/28

## 2022-05-28 NOTE — Telephone Encounter (Signed)
-----   Message from Sueanne Margarita, MD sent at 05/26/2022  7:48 AM EDT ----- Order ResMed CPAP on auto from 4 to 15cm H2O with heated humidity, mask of choice

## 2022-05-28 NOTE — Telephone Encounter (Signed)
Patient notified of sleep study results and recommendations. She states that she cannot tolerate anything on her body when she sleeps. She states that she could barely stand the pulse ox on her hand. She asked questions about the inspire device. She was informed that she has to try and fail CPAP as least 6 months before the insurance will cover the surgery. She was also informed that her BMI is too high. She wants me to ask Dr Radford Pax if a oral appliance will be a option for her. Message will be forwarded to Dr Radford Pax for review.

## 2022-05-30 ENCOUNTER — Other Ambulatory Visit: Payer: Self-pay | Admitting: Cardiology

## 2022-05-30 DIAGNOSIS — G4733 Obstructive sleep apnea (adult) (pediatric): Secondary | ICD-10-CM

## 2022-05-30 NOTE — Telephone Encounter (Signed)
Patient notified per Dr Radford Pax she recommends she first does mask desensitization and start CPAP. If she fails, then we will look at other options. Patient voiced understanding and agrees to recommendation. Order has been placed in EPIC for mask desensitization. [employer] also notified CPAP order is in EPIC.

## 2022-06-03 ENCOUNTER — Other Ambulatory Visit: Payer: Self-pay | Admitting: Cardiology

## 2022-06-03 DIAGNOSIS — G4733 Obstructive sleep apnea (adult) (pediatric): Secondary | ICD-10-CM

## 2022-06-05 ENCOUNTER — Ambulatory Visit: Payer: 59 | Attending: Cardiovascular Disease

## 2022-06-05 DIAGNOSIS — R0609 Other forms of dyspnea: Secondary | ICD-10-CM | POA: Diagnosis not present

## 2022-06-05 LAB — EXERCISE TOLERANCE TEST
Angina Index: 0
Duke Treadmill Score: 3
Estimated workload: 4.6
Exercise duration (min): 3 min
Exercise duration (sec): 0 s
MPHR: 161 {beats}/min
Peak HR: 151 {beats}/min
Percent HR: 93 %
RPE: 17
Rest HR: 76 {beats}/min
ST Depression (mm): 0 mm

## 2022-06-11 ENCOUNTER — Telehealth: Payer: Self-pay | Admitting: Cardiovascular Disease

## 2022-06-11 NOTE — Telephone Encounter (Signed)
I have the patient calling in regards to calls she has been getting for CPAP

## 2022-06-11 NOTE — Telephone Encounter (Signed)
Reached out to the patient .All questions (if any) were answered. Erica Ferguson, [place] 06/11/2022 10:54 AM

## 2022-06-13 ENCOUNTER — Other Ambulatory Visit (HOSPITAL_COMMUNITY): Payer: Self-pay

## 2022-06-13 ENCOUNTER — Telehealth: Payer: 59 | Admitting: Family Medicine

## 2022-06-13 DIAGNOSIS — R3989 Other symptoms and signs involving the genitourinary system: Secondary | ICD-10-CM | POA: Diagnosis not present

## 2022-06-13 MED ORDER — CEPHALEXIN 500 MG PO CAPS
500.0000 mg | ORAL_CAPSULE | Freq: Two times a day (BID) | ORAL | 0 refills | Status: AC
  Filled 2022-06-13: qty 14, 7d supply, fill #0

## 2022-06-13 NOTE — Patient Instructions (Signed)
Urinary Tract Infection, Adult  A urinary tract infection (UTI) is an infection of any part of the urinary tract. The urinary tract includes the kidneys, ureters, bladder, and urethra. These organs make, store, and get rid of urine in the body. An upper UTI affects the ureters and kidneys. A lower UTI affects the bladder and urethra. What are the causes? Most urinary tract infections are caused by bacteria in your genital area around your urethra, where urine leaves your body. These bacteria grow and cause inflammation of your urinary tract. What increases the risk? You are more likely to develop this condition if: You have a urinary catheter that stays in place. You are not able to control when you urinate or have a bowel movement (incontinence). You are female and you: Use a spermicide or diaphragm for birth control. Have low estrogen levels. Are pregnant. You have certain genes that increase your risk. You are sexually active. You take antibiotic medicines. You have a condition that causes your flow of urine to slow down, such as: An enlarged prostate, if you are female. Blockage in your urethra. A kidney stone. A nerve condition that affects your bladder control (neurogenic bladder). Not getting enough to drink, or not urinating often. You have certain medical conditions, such as: Diabetes. A weak disease-fighting system (immunesystem). Sickle cell disease. Gout. Spinal cord injury. What are the signs or symptoms? Symptoms of this condition include: Needing to urinate right away (urgency). Frequent urination. This may include small amounts of urine each time you urinate. Pain or burning with urination. Blood in the urine. Urine that smells bad or unusual. Trouble urinating. Cloudy urine. Vaginal discharge, if you are female. Pain in the abdomen or the lower back. You may also have: Vomiting or a decreased appetite. Confusion. Irritability or tiredness. A fever or  chills. Diarrhea. The first symptom in older adults may be confusion. In some cases, they may not have any symptoms until the infection has worsened. How is this diagnosed? This condition is diagnosed based on your medical history and a physical exam. You may also have other tests, including: Urine tests. Blood tests. Tests for STIs (sexually transmitted infections). If you have had more than one UTI, a cystoscopy or imaging studies may be done to determine the cause of the infections. How is this treated? Treatment for this condition includes: Antibiotic medicine. Over-the-counter medicines to treat discomfort. Drinking enough water to stay hydrated. If you have frequent infections or have other conditions such as a kidney stone, you may need to see a health care provider who specializes in the urinary tract (urologist). In rare cases, urinary tract infections can cause sepsis. Sepsis is a life-threatening condition that occurs when the body responds to an infection. Sepsis is treated in the hospital with IV antibiotics, fluids, and other medicines. Follow these instructions at home:  Medicines Take over-the-counter and prescription medicines only as told by your health care provider. If you were prescribed an antibiotic medicine, take it as told by your health care provider. Do not stop using the antibiotic even if you start to feel better. General instructions Make sure you: Empty your bladder often and completely. Do not hold urine for long periods of time. Empty your bladder after sex. Wipe from front to back after urinating or having a bowel movement if you are female. Use each tissue only one time when you wipe. Drink enough fluid to keep your urine pale yellow. Keep all follow-up visits. This is important. Contact a health   care provider if: Your symptoms do not get better after 1-2 days. Your symptoms go away and then return. Get help right away if: You have severe pain in  your back or your lower abdomen. You have a fever or chills. You have nausea or vomiting. Summary A urinary tract infection (UTI) is an infection of any part of the urinary tract, which includes the kidneys, ureters, bladder, and urethra. Most urinary tract infections are caused by bacteria in your genital area. Treatment for this condition often includes antibiotic medicines. If you were prescribed an antibiotic medicine, take it as told by your health care provider. Do not stop using the antibiotic even if you start to feel better. Keep all follow-up visits. This is important. This information is not intended to replace advice given to you by your health care provider. Make sure you discuss any questions you have with your health care provider. Document Revised: 10/07/2019 Document Reviewed: 10/07/2019 Elsevier Patient Education  2023 Elsevier Inc.  

## 2022-06-13 NOTE — Progress Notes (Signed)
Virtual Visit Consent   [name], you are scheduled for a virtual visit with a [facility] provider today. Just as with appointments in the office, your consent must be obtained to participate. Your consent will be active for this visit and any virtual visit you may have with one of our providers in the next 365 days. If you have a MyChart account, a copy of this consent can be sent to you electronically.  As this is a virtual visit, video technology does not allow for your provider to perform a traditional examination. This may limit your provider's ability to fully assess your condition. If your provider identifies any concerns that need to be evaluated in person or the need to arrange testing (such as labs, EKG, etc.), we will make arrangements to do so. Although advances in technology are sophisticated, we cannot ensure that it will always work on either your end or our end. If the connection with a video visit is poor, the visit may have to be switched to a telephone visit. With either a video or telephone visit, we are not always able to ensure that we have a secure connection.  By engaging in this virtual visit, you consent to the provision of healthcare and authorize for your insurance to be billed (if applicable) for the services provided during this visit. Depending on your insurance coverage, you may receive a charge related to this service.  I need to obtain your verbal consent now. Are you willing to proceed with your visit today? Erica Ferguson has provided verbal consent on 06/13/2022 for a virtual visit (video or telephone). Georgana Curio, FNP  Date: 06/13/2022 8:04 AM  Virtual Visit via Video Note   I, Georgana Curio, connected with  Erica Ferguson  ([id number], Dec 24, 1965) on 06/13/22 at  8:00 AM EDT by a video-enabled telemedicine application and verified that I am speaking with the correct person using two identifiers.  Location: Patient: Virtual Visit Location Patient:  Home Provider: Virtual Visit Location Provider: Home Office   I discussed the limitations of evaluation and management by telemedicine and the availability of in person appointments. The patient expressed understanding and agreed to proceed.    History of Present Illness: Erica Ferguson is a 57 y.o. who identifies as a female who was assigned female at birth, and is being seen today for frequency with urination and lower abd pressure. No fever. No back pain. Marland Kitchen  HPI: HPI  Problems:  Patient Active Problem List   Diagnosis Date Noted   Exertional dyspnea 05/23/2022   Bradycardia 01/07/2022   Primary hypertension 07/03/2021   Suspected sleep apnea 06/28/2021   Morbid obesity with BMI of 45.0-49.9, adult 03/20/2021   Acute cystitis with hematuria 08/22/2020   Prediabetes 08/22/2020   Elevated blood pressure reading in office without diagnosis of hypertension 08/22/2020   B12 deficiency 08/22/2020    Allergies:  Allergies  Allergen Reactions   Pork Allergy Hives and Swelling   Medications:  Current Outpatient Medications:    cephALEXin (KEFLEX) 500 MG capsule, Take 1 capsule (500 mg total) by mouth 2 (two) times daily for 7 days., Disp: 14 capsule, Rfl: 0   albuterol (VENTOLIN HFA) 108 (90 Base) MCG/ACT inhaler, Inhale 1-2 puffs into the lungs every 4-6 hours as needed., Disp: 6.7 g, Rfl: 2   EPINEPHrine 0.3 mg/0.3 mL IJ SOAJ injection, For acute onset of lip swelling and airway obstruction, inject 1 pen into thigh muscle, call 911 and note time of  first injection.  Wait 5 minutes.  If symptoms have not completely resolved, give second injection in other thigh muscle and note the time of second injection. (Patient taking differently: For acute onset of lip swelling and airway obstruction, inject 1 pen into thigh muscle, call 911 and note time of first injection.  Wait 5 minutes.  If symptoms have not completely resolved, give second injection in other thigh muscle and note the time of  second injection.), Disp: 2 each, Rfl: 5   fluticasone (FLONASE) 50 MCG/ACT nasal spray, Place 2 sprays into both nostrils daily., Disp: 16 g, Rfl: 0   olmesartan-hydrochlorothiazide (BENICAR HCT) 40-25 MG tablet, Take 1 tablet by mouth daily., Disp: 90 tablet, Rfl: 3  Observations/Objective: Patient is well-developed, well-nourished in no acute distress.  Resting comfortably in parked car at work Head is normocephalic, atraumatic.  No labored breathing.  Speech is clear and coherent with logical content.  Patient is alert and oriented at baseline.    Assessment and Plan: 1. Suspected UTI  Increase fluids, see preventative measures, UC if sx worsen.   Follow Up Instructions: I discussed the assessment and treatment plan with the patient. The patient was provided an opportunity to ask questions and all were answered. The patient agreed with the plan and demonstrated an understanding of the instructions.  A copy of instructions were sent to the patient via MyChart unless otherwise noted below.     The patient was advised to call back or seek an in-person evaluation if the symptoms worsen or if the condition fails to improve as anticipated.  Time:  I spent 8 minutes with the patient via telehealth technology discussing the above problems/concerns.    Georgana Curioiane Terrius Gentile, FNP

## 2022-06-20 ENCOUNTER — Other Ambulatory Visit (HOSPITAL_BASED_OUTPATIENT_CLINIC_OR_DEPARTMENT_OTHER): Payer: Self-pay | Admitting: Nurse Practitioner

## 2022-06-20 ENCOUNTER — Other Ambulatory Visit (HOSPITAL_COMMUNITY): Payer: Self-pay

## 2022-06-20 DIAGNOSIS — N939 Abnormal uterine and vaginal bleeding, unspecified: Secondary | ICD-10-CM | POA: Diagnosis not present

## 2022-06-20 DIAGNOSIS — E119 Type 2 diabetes mellitus without complications: Secondary | ICD-10-CM | POA: Diagnosis not present

## 2022-06-20 DIAGNOSIS — Z13220 Encounter for screening for lipoid disorders: Secondary | ICD-10-CM | POA: Diagnosis not present

## 2022-06-20 DIAGNOSIS — R5383 Other fatigue: Secondary | ICD-10-CM | POA: Diagnosis not present

## 2022-06-20 MED ORDER — ALBUTEROL SULFATE HFA 108 (90 BASE) MCG/ACT IN AERS
1.0000 | INHALATION_SPRAY | RESPIRATORY_TRACT | 2 refills | Status: DC | PRN
  Filled 2022-06-20: qty 6.7, 17d supply, fill #0

## 2022-06-21 ENCOUNTER — Other Ambulatory Visit (HOSPITAL_COMMUNITY): Payer: Self-pay

## 2022-06-23 ENCOUNTER — Encounter (HOSPITAL_BASED_OUTPATIENT_CLINIC_OR_DEPARTMENT_OTHER): Payer: Self-pay

## 2022-06-23 ENCOUNTER — Ambulatory Visit (HOSPITAL_BASED_OUTPATIENT_CLINIC_OR_DEPARTMENT_OTHER): Admission: RE | Admit: 2022-06-23 | Payer: 59 | Source: Ambulatory Visit

## 2022-07-11 DIAGNOSIS — G4733 Obstructive sleep apnea (adult) (pediatric): Secondary | ICD-10-CM | POA: Diagnosis not present

## 2022-07-17 ENCOUNTER — Other Ambulatory Visit (HOSPITAL_COMMUNITY): Payer: Self-pay

## 2022-07-17 MED ORDER — METHYLPREDNISOLONE 4 MG PO TBPK
ORAL_TABLET | ORAL | 0 refills | Status: DC
  Filled 2022-07-17: qty 21, 6d supply, fill #0

## 2022-07-17 MED ORDER — METHYLPREDNISOLONE 4 MG PO TBPK
ORAL_TABLET | ORAL | 0 refills | Status: DC
  Filled 2022-07-17 (×2): qty 21, 6d supply, fill #0

## 2022-07-17 MED ORDER — BENZONATATE 100 MG PO CAPS
100.0000 mg | ORAL_CAPSULE | Freq: Three times a day (TID) | ORAL | 0 refills | Status: DC
  Filled 2022-07-17: qty 30, 10d supply, fill #0

## 2022-07-24 ENCOUNTER — Ambulatory Visit (INDEPENDENT_AMBULATORY_CARE_PROVIDER_SITE_OTHER): Payer: 59 | Admitting: Cardiovascular Disease

## 2022-07-24 ENCOUNTER — Encounter (HOSPITAL_BASED_OUTPATIENT_CLINIC_OR_DEPARTMENT_OTHER): Payer: Self-pay | Admitting: Cardiovascular Disease

## 2022-07-24 VITALS — BP 126/82 | HR 77 | Ht 62.0 in | Wt 281.4 lb

## 2022-07-24 DIAGNOSIS — G4733 Obstructive sleep apnea (adult) (pediatric): Secondary | ICD-10-CM

## 2022-07-24 DIAGNOSIS — E7800 Pure hypercholesterolemia, unspecified: Secondary | ICD-10-CM | POA: Diagnosis not present

## 2022-07-24 DIAGNOSIS — Z13220 Encounter for screening for lipoid disorders: Secondary | ICD-10-CM | POA: Diagnosis not present

## 2022-07-24 DIAGNOSIS — I10 Essential (primary) hypertension: Secondary | ICD-10-CM

## 2022-07-24 DIAGNOSIS — Z6841 Body Mass Index (BMI) 40.0 and over, adult: Secondary | ICD-10-CM | POA: Diagnosis not present

## 2022-07-24 DIAGNOSIS — R001 Bradycardia, unspecified: Secondary | ICD-10-CM | POA: Diagnosis not present

## 2022-07-24 HISTORY — DX: Pure hypercholesterolemia, unspecified: E78.00

## 2022-07-24 NOTE — Progress Notes (Signed)
Cardiology Office Note:    Date:  07/24/2022   ID:  Erica Ferguson, DOB 1965-08-06, MRN [id number]  PCP:  Barbette Merino, NP   [facility] Providers Cardiologist:  None     Referring MD: No ref. provider found    History of Present Illness:    Erica Ferguson is a 57 y.o. female with a hx of bradycardia, hypertension, morbid obesity, and OSA, here for for follow-up. She was initially seen 04/29/2022 for the evaluation of bradycardia.   At her initial appointment, she noted that her heart rate had been consistently bradycardic at prior clinic visits. Her blood pressure was uncontrolled, so we increased her olmesartan/HCTZ to 40/25 mg daily. She complained of exertional dyspnea. We planned for coronary CTA which was ordered but not completed. She had an exercise tolerance test 05/2022 which was low risk and negative for ischemia. Also she had reported snoring and daytime somnolence. She completed an [place] sleep study that was positive for moderate obstructive sleep apnea and was recommended for CPAP.  Today, she complains of a cough and fatigue since last week. She believes that she may have bronchitis. Additionally she has heard herself wheezing, especially while walking. This has been present since before the bronchitis. Tomorrow she is scheduled to see pulmonology. She hasn't monitored her blood pressure at home. However she does feel that it has improved lately. In the office today her BP reading is 126/82. At random times she has noticed that her lips appear to be swollen. She has previously seen an allergist but she still experiences tingling in her lips associated with the mild swelling. She has completely cut out shrimp and pork from her diet. Had eaten crab one time and didn't notice subsequent symptoms. In the last couple of weeks she has done better with making sure to eat breakfast, and to avoid eating past 8 PM. She continues to work on cooking more meals at home rather than ordering  out. For exercise, she is scheduled for Zumba on Saturday. Recently she obtained her CPAP but unfortunately lost the mouthpiece, which she is working on obtaining. Of note, she reports that her coronary CTA wasn't completed as she had been instructed to pay the costs up front. She denies any palpitations, chest pain, shortness of breath, or peripheral edema. No lightheadedness, headaches, syncope, orthopnea, or PND.   Past Medical History:  Diagnosis Date   Allergy    seasonal allergies   Angioedema    Exertional dyspnea 05/23/2022   Pure hypercholesterolemia 07/24/2022   Urticaria     Past Surgical History:  Procedure Laterality Date   BREAST REDUCTION SURGERY  1989   REDUCTION MAMMAPLASTY     WISDOM TOOTH EXTRACTION Bilateral 1993    Current Medications: Current Meds  Medication Sig   albuterol (VENTOLIN HFA) 108 (90 Base) MCG/ACT inhaler Inhale 1-2 puffs into the lungs every 4-6 hours as needed.   benzonatate (TESSALON) 100 MG capsule Take 1 capsule (100 mg total) by mouth 3 (three) times daily.   EPINEPHrine 0.3 mg/0.3 mL IJ SOAJ injection For acute onset of lip swelling and airway obstruction, inject 1 pen into thigh muscle, call 911 and note time of first injection.  Wait 5 minutes.  If symptoms have not completely resolved, give second injection in other thigh muscle and note the time of second injection. (Patient taking differently: For acute onset of lip swelling and airway obstruction, inject 1 pen into thigh muscle, call 911 and note time of first  injection.  Wait 5 minutes.  If symptoms have not completely resolved, give second injection in other thigh muscle and note the time of second injection.)   fluticasone (FLONASE) 50 MCG/ACT nasal spray Place 2 sprays into both nostrils daily. (Patient taking differently: Place 2 sprays into both nostrils as needed for allergies or rhinitis.)   olmesartan-hydrochlorothiazide (BENICAR HCT) 40-25 MG tablet Take 1 tablet by mouth daily.    [DISCONTINUED] albuterol (VENTOLIN HFA) 108 (90 Base) MCG/ACT inhaler Inhale 1-2 puffs into the lungs every 4-6 hours as needed.     Allergies:   Pork allergy   Social History   Socioeconomic History   Marital status: Single    Spouse name: Not on file   Number of children: Not on file   Years of education: Not on file   Highest education level: Not on file  Occupational History   Not on file  Tobacco Use   Smoking status: Never   Smokeless tobacco: Never  Vaping Use   Vaping Use: Never used  Substance and Sexual Activity   Alcohol use: Not Currently   Drug use: Never   Sexual activity: Not Currently  Other Topics Concern   Not on file  Social History Narrative   Not on file   Social Determinants of Health   Financial Resource Strain: Not on file  Food Insecurity: No Food Insecurity (04/29/2022)   Hunger Vital Sign    Worried About Running Out of Food in the Last Year: Never true    Ran Out of Food in the Last Year: Never true  Transportation Needs: No Transportation Needs (04/29/2022)   PRAPARE - Administrator, Civil Service (Medical): No    Lack of Transportation (Non-Medical): No  Physical Activity: Inactive (04/29/2022)   Exercise Vital Sign    Days of Exercise per Week: 0 days    Minutes of Exercise per Session: 0 min  Stress: Not on file  Social Connections: Not on file     Family History: The patient's family history includes Heart attack in her mother; Hypertension in her father and mother; Kidney failure in her mother; Stroke in her maternal grandmother. There is no history of Colon polyps, Colon cancer, Esophageal cancer, Rectal cancer, or Stomach cancer.  ROS:   Please see the history of present illness.    (+) Cough (+) Fatigue (+) Tingling sensations and swelling of lips All other systems reviewed and are negative.  EKGs/Labs/Other Studies Reviewed:    The following studies were reviewed today:  ETT  06/05/2022:   Negative ECG stress  test for ischemia.   Poor exercise capacity (3:00 min:s; 4.6 METS). Normal HR response to exercise. Hypertensive response to exercise.  TTE  06/23/2016  [facility]): CONCLUSION:  1. Mild borderline left atrial enlargement.  2. Concentric left ventricular hypertrophy.  3. Normal left ventricular systolic function.  4. Mild tricuspid insufficiency.    EKG:   EKG is personally reviewed. 07/24/2022:  EKG was not ordered. 04/29/2022: Sinus rhythm. Rate 62 bpm.  Recent Labs: No results found for requested labs within last 365 days.   Recent Lipid Panel    Component Value Date/Time   CHOL 191 07/02/2021 0911   TRIG 117 07/02/2021 0911   HDL 46 07/02/2021 0911   CHOLHDL 4.2 07/02/2021 0911   LDLCALC 124 (H) 07/02/2021 0911     Risk Assessment/Calculations:      STOP-Bang Score:  6       Physical Exam:  Wt Readings from Last 3 Encounters:  07/24/22 281 lb 6.4 oz (127.6 kg)  04/29/22 285 lb (129.3 kg)  10/01/21 267 lb (121.1 kg)     VS:  BP 126/82 (BP Location: Left Arm, Patient Position: Sitting, Cuff Size: Large)   Pulse 77   Ht 5\' 2"  (1.575 m)   Wt 281 lb 6.4 oz (127.6 kg)   SpO2 94%   BMI 51.47 kg/m  , BMI Body mass index is 51.47 kg/m. GENERAL:  Well appearing HEENT: Pupils equal round and reactive, fundi not visualized, oral mucosa unremarkable NECK:  No jugular venous distention, waveform within normal limits, carotid upstroke brisk and symmetric, no bruits, no thyromegaly LUNGS:  Clear to auscultation bilaterally HEART:  RRR.  PMI not displaced or sustained,S1 and S2 within normal limits, no S3, no S4, no clicks, no rubs, no murmurs ABD:  Flat, positive bowel sounds normal in frequency in pitch, no bruits, no rebound, no guarding, no midline pulsatile mass, no hepatomegaly, no splenomegaly EXT:  2 plus pulses throughout, no edema, no cyanosis no clubbing SKIN:  No rashes no nodules NEURO:  Cranial nerves II through XII grossly intact, motor grossly  intact throughout PSYCH:  Cognitively intact, oriented to person place and time   ASSESSMENT:    1. Primary hypertension   2. OSA (obstructive sleep apnea)   3. Bradycardia   4. BMI 50.0-59.9, adult (HCC)   5. Need for lipid screening   6. Pure hypercholesterolemia     PLAN:    Primary hypertension BP stable on olmesartan/HCTZ.  Limit sodium intake and exercise at least 150 minutes weekly.  OSA (obstructive sleep apnea) She was recently diagnosed with OSA.  She hasn't been able to use the machine due to losing the mask.  She plans to get a new mask today.  Continue working on diet and exercise.   Bradycardia Heart rate stable.  Avoid nodal agents.  Morbid obesity with BMI of 45.0-49.9, adult (HCC) She is struggled with her weight and is asking for help.  Recommend that she be referred to the healthy weight and wellness clinic.  We did discuss the importance of increasing exercise to at least 150 minutes.  She is already making some dietary changes.  Pure hypercholesterolemia Lipids are uncontrolled.  We will get a coronary calicium score.  Disposition: FU with Kauan Kloosterman C. Duke Salvia, MD, [facility] in 1 year.  Medication Adjustments/Labs and Tests Ordered: Current medicines are reviewed at length with the patient today.  Concerns regarding medicines are outlined above.   Orders Placed This Encounter  Procedures   CT CARDIAC SCORING (SELF PAY ONLY)   Lipid panel   Comprehensive metabolic panel   Ambulatory referral to [facility]   No orders of the defined types were placed in this encounter.  Patient Instructions  Medication Instructions:  Your physician recommends that you continue on your current medications as directed. Please refer to the Current Medication list given to you today.   *If you need a refill on your cardiac medications before your next appointment, please call your pharmacy*  Lab Work: FASTING LP/CMET SOON   If you have labs (blood work) drawn today  and your tests are completely normal, you will receive your results only by: MyChart Message (if you have MyChart) OR A paper copy in the mail If you have any lab test that is abnormal or we need to change your treatment, we will call you to review the results.  Testing/Procedures: CALCIUM SCORE - THIS  WILL COST YOU $99 OUT OF POCKET   Follow-Up: At [facility], you and your health needs are our priority.  As part of our continuing mission to provide you with exceptional heart care, we have created designated Provider Care Teams.  These Care Teams include your primary Cardiologist (physician) and Advanced Practice Providers (APPs -  Physician Assistants and Nurse Practitioners) who all work together to provide you with the care you need, when you need it.  We recommend signing up for the patient portal called "MyChart".  Sign up information is provided on this After Visit Summary.  MyChart is used to connect with patients for Virtual Visits (Telemedicine).  Patients are able to view lab/test results, encounter notes, upcoming appointments, etc.  Non-urgent messages can be sent to your provider as well.   To learn more about what you can do with MyChart, go to [email or web address].    Your next appointment:   12 month(s)  Provider:   Chilton Si, MD    You have been referred to [facility]    I,Mathew Stumpf,acting as a scribe for Chilton Si, MD.,have documented all relevant documentation on the behalf of Chilton Si, MD,as directed by  Chilton Si, MD while in the presence of Chilton Si, MD.  I, Keah Lamba C. Duke Salvia, MD have reviewed all documentation for this visit.  The documentation of the exam, diagnosis, procedures, and orders on 07/24/2022 are all accurate and complete.  Signed, Chilton Si, MD  07/24/2022 4:51 PM    [facility]

## 2022-07-24 NOTE — Assessment & Plan Note (Signed)
She is struggled with her weight and is asking for help.  Recommend that she be referred to the healthy weight and wellness clinic.  We did discuss the importance of increasing exercise to at least 150 minutes.  She is already making some dietary changes.

## 2022-07-24 NOTE — Assessment & Plan Note (Signed)
Lipids are uncontrolled.  We will get a coronary calicium score.

## 2022-07-24 NOTE — Patient Instructions (Signed)
Medication Instructions:  Your physician recommends that you continue on your current medications as directed. Please refer to the Current Medication list given to you today.   *If you need a refill on your cardiac medications before your next appointment, please call your pharmacy*  Lab Work: FASTING LP/CMET SOON   If you have labs (blood work) drawn today and your tests are completely normal, you will receive your results only by: MyChart Message (if you have MyChart) OR A paper copy in the mail If you have any lab test that is abnormal or we need to change your treatment, we will call you to review the results.  Testing/Procedures: CALCIUM SCORE - THIS WILL COST YOU $99 OUT OF POCKET   Follow-Up: At [facility], you and your health needs are our priority.  As part of our continuing mission to provide you with exceptional heart care, we have created designated Provider Care Teams.  These Care Teams include your primary Cardiologist (physician) and Advanced Practice Providers (APPs -  Physician Assistants and Nurse Practitioners) who all work together to provide you with the care you need, when you need it.  We recommend signing up for the patient portal called "MyChart".  Sign up information is provided on this After Visit Summary.  MyChart is used to connect with patients for Virtual Visits (Telemedicine).  Patients are able to view lab/test results, encounter notes, upcoming appointments, etc.  Non-urgent messages can be sent to your provider as well.   To learn more about what you can do with MyChart, go to [email or web address].    Your next appointment:   12 month(s)  Provider:   Chilton Si, MD    You have been referred to [facility]

## 2022-07-24 NOTE — Assessment & Plan Note (Signed)
She was recently diagnosed with OSA.  She hasn't been able to use the machine due to losing the mask.  She plans to get a new mask today.  Continue working on diet and exercise.

## 2022-07-24 NOTE — Assessment & Plan Note (Addendum)
BP stable on olmesartan/HCTZ.  Limit sodium intake and exercise at least 150 minutes weekly.

## 2022-07-24 NOTE — Assessment & Plan Note (Signed)
Heart rate stable.  Avoid nodal agents.

## 2022-07-25 ENCOUNTER — Encounter: Payer: Self-pay | Admitting: Pulmonary Disease

## 2022-07-25 ENCOUNTER — Other Ambulatory Visit (HOSPITAL_COMMUNITY): Payer: Self-pay

## 2022-07-25 ENCOUNTER — Ambulatory Visit (INDEPENDENT_AMBULATORY_CARE_PROVIDER_SITE_OTHER): Payer: 59 | Admitting: Pulmonary Disease

## 2022-07-25 VITALS — BP 132/78 | HR 91 | Ht 62.0 in | Wt 282.0 lb

## 2022-07-25 DIAGNOSIS — R053 Chronic cough: Secondary | ICD-10-CM

## 2022-07-25 MED ORDER — AZITHROMYCIN 250 MG PO TABS
ORAL_TABLET | ORAL | 0 refills | Status: DC
  Filled 2022-07-25: qty 6, 5d supply, fill #0

## 2022-07-25 MED ORDER — BUDESONIDE-FORMOTEROL FUMARATE 160-4.5 MCG/ACT IN AERO
2.0000 | INHALATION_SPRAY | Freq: Two times a day (BID) | RESPIRATORY_TRACT | 12 refills | Status: AC
  Filled 2022-07-25: qty 10.2, 30d supply, fill #0

## 2022-07-25 MED ORDER — PREDNISONE 20 MG PO TABS
20.0000 mg | ORAL_TABLET | Freq: Every day | ORAL | 0 refills | Status: DC
  Filled 2022-07-25: qty 7, 7d supply, fill #0

## 2022-07-25 NOTE — Progress Notes (Signed)
Erica Ferguson    [id number]    1966/01/04  Primary Care Physician:King, Shana Chute, NP  Referring Physician: Barbette Merino, NP [address] [place],  [place] [address]  Chief complaint:   Cough, shortness of breath  HPI:  Patient seen for cough, shortness of breath Cough and wheezing  Cough was initially productive but not as productive at present  She has been having some wheezing,  History of reflux previously and she was using Prilosec just as needed  Recently had a sleep study showing moderate obstructive sleep apnea Has not started treatment yet I believe with follow-up with cardiology   Outpatient Encounter Medications as of 07/25/2022  Medication Sig   albuterol (VENTOLIN HFA) 108 (90 Base) MCG/ACT inhaler Inhale 1-2 puffs into the lungs every 4-6 hours as needed.   azithromycin (ZITHROMAX Z-PAK) 250 MG tablet Take 2 tablets day 1 and then 1 daily for 4 days   benzonatate (TESSALON) 100 MG capsule Take 1 capsule (100 mg total) by mouth 3 (three) times daily.   budesonide-formoterol (SYMBICORT) 160-4.5 MCG/ACT inhaler Inhale 2 puffs into the lungs every 12 (twelve) hours.   EPINEPHrine 0.3 mg/0.3 mL IJ SOAJ injection For acute onset of lip swelling and airway obstruction, inject 1 pen into thigh muscle, call 911 and note time of first injection.  Wait 5 minutes.  If symptoms have not completely resolved, give second injection in other thigh muscle and note the time of second injection. (Patient taking differently: For acute onset of lip swelling and airway obstruction, inject 1 pen into thigh muscle, call 911 and note time of first injection.  Wait 5 minutes.  If symptoms have not completely resolved, give second injection in other thigh muscle and note the time of second injection.)   fluticasone (FLONASE) 50 MCG/ACT nasal spray Place 2 sprays into both nostrils daily. (Patient taking differently: Place 2 sprays into both nostrils as needed for allergies or  rhinitis.)   olmesartan-hydrochlorothiazide (BENICAR HCT) 40-25 MG tablet Take 1 tablet by mouth daily.   predniSONE (DELTASONE) 20 MG tablet Take 1 tablet (20 mg total) by mouth daily with breakfast.   No facility-administered encounter medications on file as of 07/25/2022.    Allergies as of 07/25/2022 - Review Complete 07/25/2022  Allergen Reaction Noted   Pork allergy Hives and Swelling 04/10/2021    Past Medical History:  Diagnosis Date   Allergy    seasonal allergies   Angioedema    Exertional dyspnea 05/23/2022   Pure hypercholesterolemia 07/24/2022   Urticaria     Past Surgical History:  Procedure Laterality Date   BREAST REDUCTION SURGERY  1989   REDUCTION MAMMAPLASTY     WISDOM TOOTH EXTRACTION Bilateral 1993    Family History  Problem Relation Age of Onset   Heart attack Mother    Kidney failure Mother    Hypertension Mother    Hypertension Father    Stroke Maternal Grandmother    Colon polyps Neg Hx    Colon cancer Neg Hx    Esophageal cancer Neg Hx    Rectal cancer Neg Hx    Stomach cancer Neg Hx     Social History   Socioeconomic History   Marital status: Single    Spouse name: Not on file   Number of children: Not on file   Years of education: Not on file   Highest education level: Not on file  Occupational History   Not on file  Tobacco Use   Smoking status: Never   Smokeless tobacco: Never  Vaping Use   Vaping Use: Never used  Substance and Sexual Activity   Alcohol use: Not Currently   Drug use: Never   Sexual activity: Not Currently  Other Topics Concern   Not on file  Social History Narrative   Not on file   Social Determinants of Health   Financial Resource Strain: Not on file  Food Insecurity: No Food Insecurity (04/29/2022)   Hunger Vital Sign    Worried About Running Out of Food in the Last Year: Never true    Ran Out of Food in the Last Year: Never true  Transportation Needs: No Transportation Needs (04/29/2022)    PRAPARE - Administrator, Civil Service (Medical): No    Lack of Transportation (Non-Medical): No  Physical Activity: Inactive (04/29/2022)   Exercise Vital Sign    Days of Exercise per Week: 0 days    Minutes of Exercise per Session: 0 min  Stress: Not on file  Social Connections: Not on file  Intimate Partner Violence: Not on file    Review of Systems  Respiratory:  Positive for apnea, cough and shortness of breath.   Psychiatric/Behavioral:  Positive for sleep disturbance.     Vitals:   07/25/22 1500  BP: 132/78  Pulse: 91  SpO2: 96%     Physical Exam Constitutional:      Appearance: She is obese.  HENT:     Head: Normocephalic.     Mouth/Throat:     Mouth: Mucous membranes are moist.  Eyes:     General: No scleral icterus.    Pupils: Pupils are equal, round, and reactive to light.  Cardiovascular:     Rate and Rhythm: Normal rate and regular rhythm.     Heart sounds: No murmur heard.    No friction rub.  Pulmonary:     Effort: No respiratory distress.     Breath sounds: No stridor. No wheezing or rhonchi.  Musculoskeletal:     Cervical back: No rigidity or tenderness.  Neurological:     Mental Status: She is alert.  Psychiatric:        Mood and Affect: Mood normal.    Data Reviewed: Recent sleep study read by Dr. Joselyn Glassman reviewed  No recent chest x-ray, last chest x-ray was in February 2023-within normal limits   Assessment:   Bronchitis  Recent diagnosis of obstructive sleep apnea -Yet to start therapy -Will be followed by cardiology  Wheezing  Plan/Recommendations: Prescription for azithromycin Prescription for prednisone  Prescription for Symbicort to be used 2 puffs twice a day -Once symptoms are better controlled may go to as needed with Symbicort  May consider chest x-ray and PFT if symptoms do not improve at next visit  Encouraged to follow-up with cardiology with respect to her CPAP  Follow-up in 4 to 6  weeks   Virl Diamond MD [facility] 07/25/2022, 3:30 PM  CC: Barbette Merino, NP

## 2022-07-25 NOTE — Patient Instructions (Signed)
Prescription for azithromycin and prednisone Azithromycin for 5 days Prednisone for 7 days of most  Prescription for Symbicort 2 puffs twice a day until symptoms are much controlled and then you can start using it as needed  Make sure you continue to follow-up with cardiology for the sleep apnea  Call us with significant concerns  I will see you in about 4 to 6 weeks

## 2022-08-05 ENCOUNTER — Ambulatory Visit (HOSPITAL_BASED_OUTPATIENT_CLINIC_OR_DEPARTMENT_OTHER)
Admission: RE | Admit: 2022-08-05 | Discharge: 2022-08-05 | Disposition: A | Discharge status: Home / Self Care | Payer: 59 | Source: Ambulatory Visit | Attending: Cardiovascular Disease | Admitting: Cardiovascular Disease

## 2022-08-05 DIAGNOSIS — I10 Essential (primary) hypertension: Secondary | ICD-10-CM | POA: Insufficient documentation

## 2022-08-07 DIAGNOSIS — Z13220 Encounter for screening for lipoid disorders: Secondary | ICD-10-CM | POA: Diagnosis not present

## 2022-08-07 DIAGNOSIS — I10 Essential (primary) hypertension: Secondary | ICD-10-CM | POA: Diagnosis not present

## 2022-08-07 DIAGNOSIS — Z6841 Body Mass Index (BMI) 40.0 and over, adult: Secondary | ICD-10-CM | POA: Diagnosis not present

## 2022-08-07 LAB — COMPREHENSIVE METABOLIC PANEL
ALT: 21 IU/L (ref 0–32)
AST: 16 IU/L (ref 0–40)
Albumin/Globulin Ratio: 1.3 (ref 1.2–2.2)
Albumin: 4 g/dL (ref 3.8–4.9)
Alkaline Phosphatase: 97 IU/L (ref 44–121)
BUN/Creatinine Ratio: 16 (ref 9–23)
BUN: 20 mg/dL (ref 6–24)
Bilirubin Total: 0.4 mg/dL (ref 0.0–1.2)
CO2: 25 mmol/L (ref 20–29)
Calcium: 9.7 mg/dL (ref 8.7–10.2)
Chloride: 100 mmol/L (ref 96–106)
Creatinine, Ser: 1.28 mg/dL — ABNORMAL HIGH (ref 0.57–1.00)
Globulin, Total: 3.1 g/dL (ref 1.5–4.5)
Glucose: 284 mg/dL — ABNORMAL HIGH (ref 70–99)
Potassium: 4.9 mmol/L (ref 3.5–5.2)
Sodium: 141 mmol/L (ref 134–144)
Total Protein: 7.1 g/dL (ref 6.0–8.5)
eGFR: 48 mL/min/{1.73_m2} — ABNORMAL LOW (ref >59)

## 2022-08-07 LAB — LIPID PANEL
Chol/HDL Ratio: 5.3 ratio — ABNORMAL HIGH (ref 0.0–4.4)
Cholesterol, Total: 224 mg/dL — ABNORMAL HIGH (ref 100–199)
HDL: 42 mg/dL (ref >39)
LDL Chol Calc (NIH): 146 mg/dL — ABNORMAL HIGH (ref 0–99)
Triglycerides: 196 mg/dL — ABNORMAL HIGH (ref 0–149)
VLDL Cholesterol Cal: 36 mg/dL (ref 5–40)

## 2022-08-09 IMAGING — DX DG CHEST 2V
2 series · 2 of 2 positions shown · non-contrast
Comparison: Chest x-ray 06/11/2020

CLINICAL DATA: shortness of breath

EXAM:
CHEST - 2 VIEW

[chest pa]
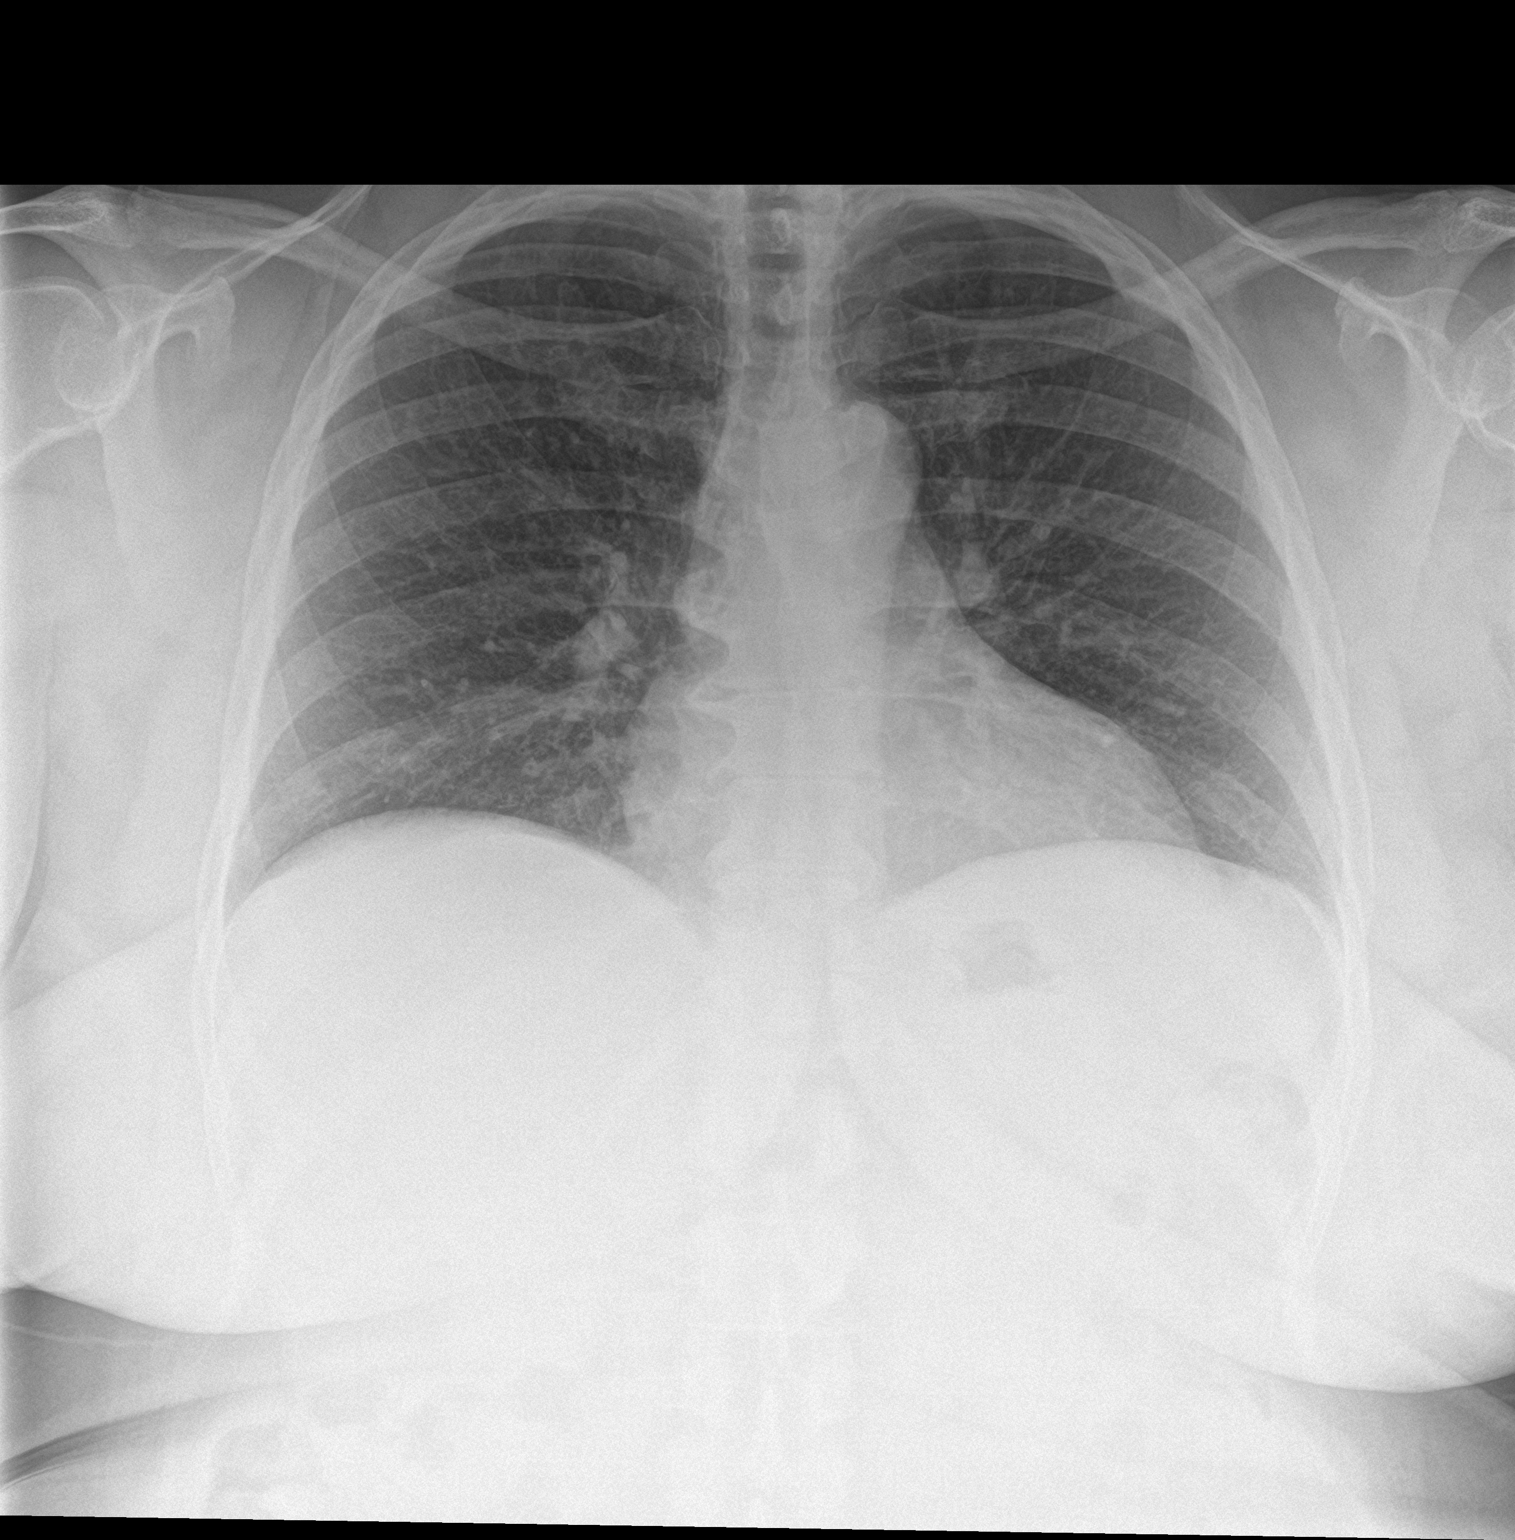

[chest lat]
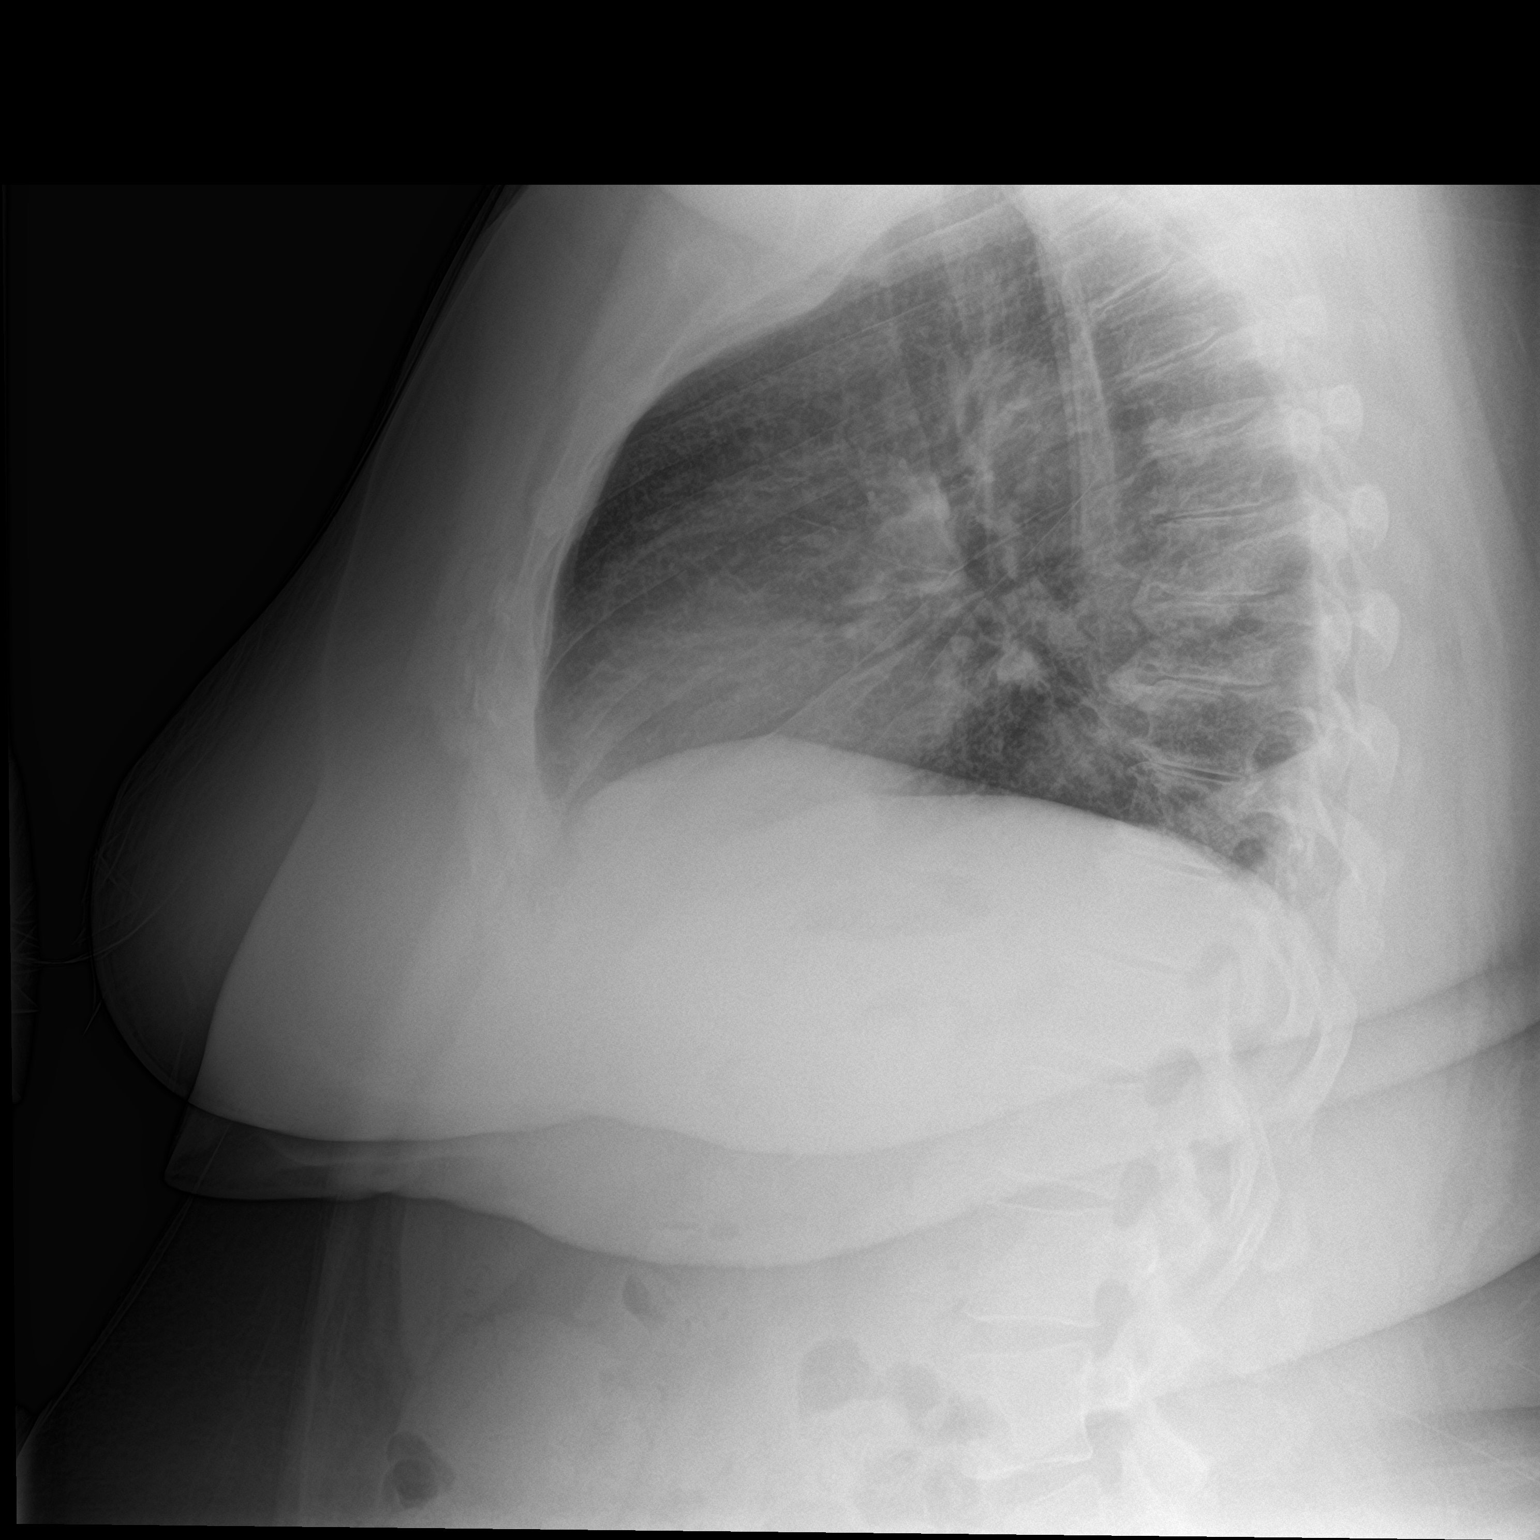

[2 of 2 positions shown; findings below may reference images not displayed]

FINDINGS: The heart and mediastinal contours are within normal limits.

No focal consolidation. No pulmonary edema. No pleural effusion. No
pneumothorax.

No acute osseous abnormality.
IMPRESSION: No active cardiopulmonary disease.

## 2022-08-11 ENCOUNTER — Ambulatory Visit (HOSPITAL_COMMUNITY)
Admission: EM | Admit: 2022-08-11 | Discharge: 2022-08-11 | Disposition: A | Discharge status: Home / Self Care | Payer: 59 | Attending: Internal Medicine | Admitting: Internal Medicine

## 2022-08-11 ENCOUNTER — Telehealth (HOSPITAL_BASED_OUTPATIENT_CLINIC_OR_DEPARTMENT_OTHER): Payer: Self-pay

## 2022-08-11 ENCOUNTER — Encounter (HOSPITAL_COMMUNITY): Payer: Self-pay | Admitting: *Deleted

## 2022-08-11 ENCOUNTER — Other Ambulatory Visit (HOSPITAL_COMMUNITY): Payer: Self-pay

## 2022-08-11 ENCOUNTER — Other Ambulatory Visit: Payer: Self-pay

## 2022-08-11 DIAGNOSIS — G4733 Obstructive sleep apnea (adult) (pediatric): Secondary | ICD-10-CM | POA: Diagnosis not present

## 2022-08-11 DIAGNOSIS — H1013 Acute atopic conjunctivitis, bilateral: Secondary | ICD-10-CM

## 2022-08-11 DIAGNOSIS — Z13220 Encounter for screening for lipoid disorders: Secondary | ICD-10-CM

## 2022-08-11 DIAGNOSIS — E7800 Pure hypercholesterolemia, unspecified: Secondary | ICD-10-CM

## 2022-08-11 DIAGNOSIS — H579 Unspecified disorder of eye and adnexa: Secondary | ICD-10-CM

## 2022-08-11 DIAGNOSIS — I10 Essential (primary) hypertension: Secondary | ICD-10-CM

## 2022-08-11 MED ORDER — EYE WASH OP SOLN
OPHTHALMIC | Status: AC
  Filled 2022-08-11: qty 118

## 2022-08-11 MED ORDER — TETRACAINE HCL 0.5 % OP SOLN
OPHTHALMIC | Status: AC
  Filled 2022-08-11: qty 4

## 2022-08-11 MED ORDER — KETOTIFEN FUMARATE 0.035 % OP SOLN
1.0000 [drp] | Freq: Two times a day (BID) | OPHTHALMIC | 0 refills | Status: DC
  Filled 2022-08-11: qty 10, 100d supply, fill #0
  Filled 2022-08-11: qty 10, 50d supply, fill #0

## 2022-08-11 MED ORDER — FLUORESCEIN SODIUM 1 MG OP STRP
ORAL_STRIP | OPHTHALMIC | Status: AC
  Filled 2022-08-11: qty 5

## 2022-08-11 NOTE — Telephone Encounter (Addendum)
Left message for patient to call back    ----- Message from Alver Sorrow, NP sent at 08/10/2022  6:51 PM EDT ----- Cholesterol elevated and increased from last year. Normal liver. Kidney function decreased from prior. Increase oral hydration and repeat BMP in 1-2 weeks for monitoring.   Based on cholesterol numbers, the 10-year ASCVD risk score (Arnett DK, et al., 2019) is: 20.7% which is elevated. Recommend starting Rosuvastatin 10mg  QD with FLP/LFT in 2 months to reassess.  If not agreeable to medication, lifestyle changes and repeat FLP/LFT in 4 months to reassess.

## 2022-08-11 NOTE — ED Triage Notes (Signed)
C/O bilat eye irritation and pruritus onset approx 1 wk ago; today states pruritus became intense and became painful, along with blurred vision. Has tried OTC lubricating eye gtts, blinking eyes frequently with slight improvement, but has since gotten worse again. Denies contact lens use.

## 2022-08-11 NOTE — ED Provider Notes (Signed)
[facility]   Note:  This document was prepared using Dragon voice recognition software and may include unintentional dictation errors.  MRN: [id number] DOB: 1965-06-14 DATE: 08/11/22   Subjective:  Chief Complaint:  Chief Complaint  Patient presents with   Eye Problem    HPI: Erica Ferguson is a 57 y.o. female presenting for pruritus of bilateral eyes for one week.  Reports no discharge from either eye.  She states the pruritus became worse today.  She has been rubbing at her eyes and blinking frequently.  She has tried over-the-counter lubricating drops with no improvement.  She wears glasses, but does not wear any contacts.  She states she is due for her annual eye exam.  Of note, she was recently diagnosed with type 2 diabetes today.  Patient has had some rhinorrhea as well as occasional sore throat.  She was recently treated for bronchitis as well.  No changes in vision or loss of vision. Denies fever, nausea/vomiting, abdominal pain, discharge from her eyes, vision loss, photophobia. Endorses bilateral pruritus of her eyes, rhinorrhea. Presents NAD.  Prior to Admission medications   Medication Sig Start Date End Date Taking? Authorizing Provider  ketotifen (ZADITOR) 0.035 % ophthalmic solution Place 1 drop into both eyes in the morning and at bedtime. Space doses 8-12 hours apart 08/11/22  Yes Karter Haire P, PA-C  olmesartan-hydrochlorothiazide (BENICAR HCT) 40-25 MG tablet Take 1 tablet by mouth daily. 04/29/22  Yes Chilton Si, MD  albuterol (VENTOLIN HFA) 108 (90 Base) MCG/ACT inhaler Inhale 1-2 puffs into the lungs every 4-6 hours as needed. 06/20/22     budesonide-formoterol (SYMBICORT) 160-4.5 MCG/ACT inhaler Inhale 2 puffs into the lungs every 12 (twelve) hours. 07/25/22   Olalere, Onnie Boer A, MD  EPINEPHrine 0.3 mg/0.3 mL IJ SOAJ injection For acute onset of lip swelling and airway obstruction, inject 1 pen into thigh muscle, call 911 and note time of first  injection.  Wait 5 minutes.  If symptoms have not completely resolved, give second injection in other thigh muscle and note the time of second injection. Patient taking differently: For acute onset of lip swelling and airway obstruction, inject 1 pen into thigh muscle, call 911 and note time of first injection.  Wait 5 minutes.  If symptoms have not completely resolved, give second injection in other thigh muscle and note the time of second injection. 02/26/21   Theadora Rama Scales, PA-C  fluticasone (FLONASE) 50 MCG/ACT nasal spray Place 2 sprays into both nostrils daily. Patient taking differently: Place 2 sprays into both nostrils as needed for allergies or rhinitis. 12/10/21   Waldon Merl, PA-C     Allergies  Allergen Reactions   Pork Allergy Hives and Swelling    History:   Past Medical History:  Diagnosis Date   Allergy    seasonal allergies   Angioedema    Exertional dyspnea 05/23/2022   Pure hypercholesterolemia 07/24/2022   Urticaria      Past Surgical History:  Procedure Laterality Date   BREAST REDUCTION SURGERY  1989   REDUCTION MAMMAPLASTY     WISDOM TOOTH EXTRACTION Bilateral 1993    Family History  Problem Relation Age of Onset   Heart attack Mother    Kidney failure Mother    Hypertension Mother    Hypertension Father    Stroke Maternal Grandmother    Colon polyps Neg Hx    Colon cancer Neg Hx    Esophageal cancer Neg Hx    Rectal cancer Neg  Hx    Stomach cancer Neg Hx     Social History   Tobacco Use   Smoking status: Never   Smokeless tobacco: Never  Vaping Use   Vaping Use: Never used  Substance Use Topics   Alcohol use: Not Currently   Drug use: Never    Review of Systems  Constitutional:  Negative for fever.  HENT:  Positive for rhinorrhea, sneezing and sore throat.   Eyes:  Positive for redness and itching. Negative for photophobia, pain, discharge and visual disturbance.  Respiratory:  Negative for cough.   Gastrointestinal:   Negative for abdominal pain, nausea and vomiting.  Neurological:  Positive for headaches.     Objective:   Vitals: BP (!) 141/81   Pulse (!) 58   Temp 97.9 F (36.6 C) (Oral)   Resp 20   SpO2 95%   Physical Exam Constitutional:      General: She is not in acute distress.    Appearance: Normal appearance. She is well-developed. She is obese. She is not ill-appearing or toxic-appearing.  HENT:     Head: Normocephalic and atraumatic.  Eyes:     General: Lids are normal.        Right eye: No discharge.        Left eye: No discharge.     Extraocular Movements: Extraocular movements intact.     Conjunctiva/sclera: Conjunctivae normal.  Cardiovascular:     Rate and Rhythm: Normal rate and regular rhythm.     Heart sounds: Normal heart sounds.  Pulmonary:     Effort: Pulmonary effort is normal.     Breath sounds: Normal breath sounds.     Comments: Clear to auscultation bilaterally  Abdominal:     General: Bowel sounds are normal.     Palpations: Abdomen is soft.     Tenderness: There is no abdominal tenderness.  Skin:    General: Skin is warm and dry.  Neurological:     General: No focal deficit present.     Mental Status: She is alert.  Psychiatric:        Mood and Affect: Mood and affect normal.     Results:  Labs: No results found for this or any previous visit (from the past 24 hour(s)).  Radiology: No results found.   UC Course/Treatments:  Procedures: Procedures   Medications Ordered in UC: Medications - No data to display   Assessment and Plan :     ICD-10-CM   1. Allergic conjunctivitis of both eyes  H10.13     2. Pruritus of both eyes  H57.9      Allergic conjunctivitis of both eyes Afebrile, nontoxic-appearing, NAD. VSS. DDX includes but not limited to: Allergic conjunctivitis, bacterial conjunctivitis, diabetic neuropathy Given pruritus with accompanying rhinorrhea, suspect allergic conjunctivitis.  Ketotifen ophthalmic 1 drop twice daily  was prescribed.  Recommend she follow-up with ophthalmology as soon as possible given her recent diabetes diagnosis. Strict ED precautions were given and patient verbalized understanding.  Pruritus of both eyes See notes above    ED Discharge Orders          Ordered    ketotifen (ZADITOR) 0.035 % ophthalmic solution  2 times daily        08/11/22 1649             PDMP not reviewed this encounter.     Arlynn Mcdermid P, PA-C 08/11/22 1705

## 2022-08-11 NOTE — Discharge Instructions (Signed)
You have been prescribed Ketotifen for your eyes. Take the drops as directed. I recommend you follow up with an ophthalmologist as soon as possible.  Our office is limited in the tests and imaging we can perform at our facility.  Therefore, it is very important for you to pay attention to any new symptoms or worsening of your current condition.   Please go directly to the Emergency Department immediately should you begin to feel worse in any way or have any of the following symptoms: increased pain, increased discharge, vision changes, loss of vision, nausea/vomiting or headache.

## 2022-08-12 ENCOUNTER — Other Ambulatory Visit: Payer: Self-pay

## 2022-08-12 ENCOUNTER — Other Ambulatory Visit (HOSPITAL_COMMUNITY): Payer: Self-pay

## 2022-08-13 ENCOUNTER — Encounter: Payer: Self-pay | Admitting: Cardiovascular Disease

## 2022-08-13 NOTE — Telephone Encounter (Signed)
Erica Ferguson at 08/13/2022  2:32 PM  Status: Signed  Pt was returning nurse Juliette Alcide call and would like a callback. Please advise     Left message to call back

## 2022-08-13 NOTE — Telephone Encounter (Signed)
This encounter was created in error - please disregard.

## 2022-08-13 NOTE — Telephone Encounter (Signed)
Patient returned RN's call. 

## 2022-08-13 NOTE — Telephone Encounter (Signed)
Pt was returning nurse [place] call and would like a callback. Please advise

## 2022-08-13 NOTE — Telephone Encounter (Signed)
Left message to call back  

## 2022-08-14 NOTE — Telephone Encounter (Signed)
Pt returning call

## 2022-08-14 NOTE — Telephone Encounter (Signed)
Returned call to patient, reviewed the following recommendations with the patient. She would like to work on diet and exercise and then have labs redrawn in 4 months. She will also have BMP redrawn to check kidney function.          ----- Message from Alver Sorrow, NP sent at 08/10/2022  6:51 PM EDT ----- Cholesterol elevated and increased from last year. Normal liver. Kidney function decreased from prior. Increase oral hydration and repeat BMP in 1-2 weeks for monitoring.    Based on cholesterol numbers, the 10-year ASCVD risk score (Arnett DK, et al., 2019) is: 20.7% which is elevated. Recommend starting Rosuvastatin 10mg  QD with FLP/LFT in 2 months to reassess.   If not agreeable to medication, lifestyle changes and repeat FLP/LFT in 4 months to reassess.

## 2022-08-14 NOTE — Addendum Note (Signed)
Addended by: Marlene Lard on: 08/14/2022 12:34 PM   Modules accepted: Orders

## 2022-08-14 NOTE — Telephone Encounter (Signed)
Pt asked that you call between 02-1229 if possible   Please call her direct line [phone]

## 2022-08-19 ENCOUNTER — Other Ambulatory Visit (HOSPITAL_COMMUNITY): Payer: Self-pay

## 2022-08-19 ENCOUNTER — Encounter (HOSPITAL_COMMUNITY): Payer: Self-pay

## 2022-08-19 DIAGNOSIS — H1045 Other chronic allergic conjunctivitis: Secondary | ICD-10-CM | POA: Diagnosis not present

## 2022-08-19 MED ORDER — LOTEPREDNOL ETABONATE 0.2 % OP SUSP
1.0000 [drp] | Freq: Three times a day (TID) | OPHTHALMIC | 0 refills | Status: DC
  Filled 2022-08-19: qty 5, 14d supply, fill #0

## 2022-08-20 ENCOUNTER — Other Ambulatory Visit (HOSPITAL_COMMUNITY): Payer: Self-pay

## 2022-08-20 MED ORDER — LOTEPREDNOL ETABONATE 0.5 % OP SUSP
1.0000 [drp] | Freq: Three times a day (TID) | OPHTHALMIC | 0 refills | Status: DC
  Filled 2022-08-20: qty 5, 34d supply, fill #0

## 2022-08-21 ENCOUNTER — Other Ambulatory Visit (HOSPITAL_COMMUNITY): Payer: Self-pay

## 2022-08-22 ENCOUNTER — Other Ambulatory Visit (HOSPITAL_COMMUNITY): Payer: Self-pay

## 2022-08-28 ENCOUNTER — Other Ambulatory Visit (HOSPITAL_COMMUNITY): Payer: Self-pay

## 2022-09-10 DIAGNOSIS — G4733 Obstructive sleep apnea (adult) (pediatric): Secondary | ICD-10-CM | POA: Diagnosis not present

## 2022-09-26 ENCOUNTER — Ambulatory Visit: Payer: 59 | Admitting: Cardiology

## 2022-10-10 ENCOUNTER — Ambulatory Visit: Payer: 59 | Attending: Cardiology | Admitting: Cardiology

## 2022-10-10 ENCOUNTER — Encounter: Payer: Self-pay | Admitting: Cardiology

## 2022-10-10 VITALS — BP 126/70 | HR 84 | Ht 62.0 in | Wt 278.0 lb

## 2022-10-10 DIAGNOSIS — R079 Chest pain, unspecified: Secondary | ICD-10-CM

## 2022-10-10 DIAGNOSIS — I10 Essential (primary) hypertension: Secondary | ICD-10-CM | POA: Diagnosis not present

## 2022-10-10 DIAGNOSIS — G4733 Obstructive sleep apnea (adult) (pediatric): Secondary | ICD-10-CM | POA: Diagnosis not present

## 2022-10-10 DIAGNOSIS — Z01812 Encounter for preprocedural laboratory examination: Secondary | ICD-10-CM

## 2022-10-10 NOTE — Progress Notes (Signed)
Sleep Medicine CONSULT Note    Date:  10/10/2022   ID:  Erica Ferguson, DOB [birth year], MRN [id number]  PCP:  Barbette Merino, NP  Cardiologist: None   Chief Complaint  Patient presents with   New Patient (Initial Visit)    OSA    History of Present Illness:  Erica Ferguson is a 57 y.o. female who is being seen today for the evaluation of obstructive sleep apnea at the request of Chilton Si, MD.  This is a 57 year old female with a history of seasonal allergies, angioedema, hyperlipidemia and shortness of breath who was recently seen by Dr. Chilton Si for evaluation of hypertension.  The patient admitted to snoring and excessive daytime sleepiness and therefore a home sleep study was ordered..  This demonstrated moderate obstructive sleep apnea with an AHI of 17.1/h with O2 sats as low as 83%.  She was started on auto CPAP from 4 to 15 cm H2O.  She is now referred for sleep medicine consultation to establish sleep care and treatment of obstructive sleep apnea  She has recently been struggling with her CPAP therapy.  She tolerates her fullface mask and feels the pressure is adequate but she has been waking up with a sore throat every morning and so she has not been using it.  No one told her how to adjust the humidity in it.  She says sometimes her nose gets dry as well.  Past Medical History:  Diagnosis Date   Allergy    seasonal allergies   Angioedema    Exertional dyspnea 05/23/2022   Pure hypercholesterolemia 07/24/2022   Urticaria     Past Surgical History:  Procedure Laterality Date   BREAST REDUCTION SURGERY  1989   REDUCTION MAMMAPLASTY     WISDOM TOOTH EXTRACTION Bilateral 1993    Current Medications: Current Meds  Medication Sig   budesonide-formoterol (SYMBICORT) 160-4.5 MCG/ACT inhaler Inhale 2 puffs into the lungs every 12 (twelve) hours.   EPINEPHrine 0.3 MG/0.3ML SOSY Inject as directed. For acute onset of lip swelling and airway obstruction,  inject 1 pen into thigh muscle, call 911 and note time of first injection.  Wait 5 minutes.  If symptoms have not completely resolved, give second injection in other thigh muscle and note the time of second injection. As needed   fexofenadine (ALLEGRA) 180 MG tablet Take 180 mg by mouth daily.   olmesartan-hydrochlorothiazide (BENICAR HCT) 40-25 MG tablet Take 1 tablet by mouth daily.    Allergies:   Pork allergy   Social History   Socioeconomic History   Marital status: Single    Spouse name: Not on file   Number of children: Not on file   Years of education: Not on file   Highest education level: Not on file  Occupational History   Not on file  Tobacco Use   Smoking status: Never   Smokeless tobacco: Never  Vaping Use   Vaping status: Never Used  Substance and Sexual Activity   Alcohol use: Not Currently   Drug use: Never   Sexual activity: Not on file  Other Topics Concern   Not on file  Social History Narrative   Not on file   Social Determinants of Health   Financial Resource Strain: Not on file  Food Insecurity: No Food Insecurity (04/29/2022)   Hunger Vital Sign    Worried About Running Out of Food in the Last Year: Never true    Ran Out of Food in  the Last Year: Never true  Transportation Needs: No Transportation Needs (04/29/2022)   PRAPARE - Administrator, Civil Service (Medical): No    Lack of Transportation (Non-Medical): No  Physical Activity: Inactive (04/29/2022)   Exercise Vital Sign    Days of Exercise per Week: 0 days    Minutes of Exercise per Session: 0 min  Stress: Not on file  Social Connections: Not on file     Family History:  The patient's family history includes Heart attack in her mother; Hypertension in her father and mother; Kidney failure in her mother; Stroke in her maternal grandmother.   ROS:   Please see the history of present illness.    ROS All other systems reviewed and are negative.      No data to display              PHYSICAL EXAM:   VS:  BP 126/70   Pulse 84   Ht 5\' 2"  (1.575 m)   Wt 278 lb (126.1 kg)   SpO2 96%   BMI 50.85 kg/m    GEN: Well nourished, well developed, in no acute distress  HEENT: normal  Neck: no JVD, carotid bruits, or masses Cardiac: RRR; no murmurs, rubs, or gallops,no edema.  Intact distal pulses bilaterally.  Respiratory:  clear to auscultation bilaterally, normal work of breathing GI: soft, nontender, nondistended, + BS MS: no deformity or atrophy  Skin: warm and dry, no rash Neuro:  Alert and Oriented x 3, Strength and sensation are intact Psych: euthymic mood, full affect  Wt Readings from Last 3 Encounters:  10/10/22 278 lb (126.1 kg)  07/25/22 282 lb (127.9 kg)  07/24/22 281 lb 6.4 oz (127.6 kg)      Studies/Labs Reviewed:   Home sleep study and PAP compliance download  Recent Labs: 08/07/2022: ALT 21; BUN 20; Creatinine, Ser 1.28; Potassium 4.9; Sodium 141     Additional studies/ records that were reviewed today include:  none    ASSESSMENT:    1. OSA (obstructive sleep apnea)   2. Primary hypertension   3. Chest pain, unspecified type   4. Pre-procedure lab exam      PLAN:  In order of problems listed above:  OSA  -I do not have a download for her today because she has not been using her device -It seems the mask and the pressure are okay but she is getting sore throat so I have asked her to get her instruction book without tonight to look up humidification and if she cannot figure out how to increase the humidity on her device she will let me know and I will get her in with the DME for further instruction  2.  Hypertension -BP is controlled on exam today -Continue prescription drug management with Benicar HCT 40-25 mg daily with as needed refills  Follow-up with me in 8 weeks  Time Spent: 20 minutes total time of encounter, including 15 minutes spent in face-to-face patient care on the date of this encounter. This time  includes coordination of care and counseling regarding above mentioned problem list. Remainder of non-face-to-face time involved reviewing chart documents/testing relevant to the patient encounter and documentation in the medical record. I have independently reviewed documentation from referring provider  Medication Adjustments/Labs and Tests Ordered: Current medicines are reviewed at length with the patient today.  Concerns regarding medicines are outlined above.  Medication changes, Labs and Tests ordered today are listed in the Patient Instructions below.  Patient Instructions  Medication Instructions:  Your physician recommends that you continue on your current medications as directed. Please refer to the Current Medication list given to you today.  *If you need a refill on your cardiac medications before your next appointment, please call your pharmacy*   Lab Work: None.  If you have labs (blood work) drawn today and your tests are completely normal, you will receive your results only by: MyChart Message (if you have MyChart) OR A paper copy in the mail If you have any lab test that is abnormal or we need to change your treatment, we will call you to review the results.   Testing/Procedures: None.   Follow-Up: At [facility], you and your health needs are our priority.  As part of our continuing mission to provide you with exceptional heart care, we have created designated Provider Care Teams.  These Care Teams include your primary Cardiologist (physician) and Advanced Practice Providers (APPs -  Physician Assistants and Nurse Practitioners) who all work together to provide you with the care you need, when you need it.  We recommend signing up for the patient portal called "MyChart".  Sign up information is provided on this After Visit Summary.  MyChart is used to connect with patients for Virtual Visits (Telemedicine).  Patients are able to view lab/test results, encounter  notes, upcoming appointments, etc.  Non-urgent messages can be sent to your provider as well.   To learn more about what you can do with MyChart, go to [email or web address].    Your next appointment:   2 month(s)  Provider:   Dr. Armanda Magic, MD     Signed, Armanda Magic, MD  10/10/2022 4:07 PM    [facility] [address] [place], [place], [place]  [address] Phone: [phone]; Fax: [phone]

## 2022-10-10 NOTE — Patient Instructions (Addendum)
Medication Instructions:  Your physician recommends that you continue on your current medications as directed. Please refer to the Current Medication list given to you today.  *If you need a refill on your cardiac medications before your next appointment, please call your pharmacy*   Lab Work: None.  If you have labs (blood work) drawn today and your tests are completely normal, you will receive your results only by: MyChart Message (if you have MyChart) OR A paper copy in the mail If you have any lab test that is abnormal or we need to change your treatment, we will call you to review the results.   Testing/Procedures: None.   Follow-Up: At [facility], you and your health needs are our priority.  As part of our continuing mission to provide you with exceptional heart care, we have created designated Provider Care Teams.  These Care Teams include your primary Cardiologist (physician) and Advanced Practice Providers (APPs -  Physician Assistants and Nurse Practitioners) who all work together to provide you with the care you need, when you need it.  We recommend signing up for the patient portal called "MyChart".  Sign up information is provided on this After Visit Summary.  MyChart is used to connect with patients for Virtual Visits (Telemedicine).  Patients are able to view lab/test results, encounter notes, upcoming appointments, etc.  Non-urgent messages can be sent to your provider as well.   To learn more about what you can do with MyChart, go to [email or web address].    Your next appointment:   2 month(s)  Provider:   Dr. Armanda Magic, MD

## 2022-10-11 DIAGNOSIS — G4733 Obstructive sleep apnea (adult) (pediatric): Secondary | ICD-10-CM | POA: Diagnosis not present

## 2022-10-14 ENCOUNTER — Other Ambulatory Visit (HOSPITAL_COMMUNITY): Payer: Self-pay

## 2022-10-14 ENCOUNTER — Other Ambulatory Visit: Payer: Self-pay

## 2022-10-14 MED ORDER — LOTEPREDNOL ETABONATE 0.5 % OP SUSP
OPHTHALMIC | 0 refills | Status: DC
  Filled 2022-10-14: qty 5, 14d supply, fill #0

## 2022-11-06 DIAGNOSIS — H1045 Other chronic allergic conjunctivitis: Secondary | ICD-10-CM | POA: Diagnosis not present

## 2022-11-07 ENCOUNTER — Other Ambulatory Visit (HOSPITAL_COMMUNITY): Payer: Self-pay

## 2022-11-07 MED ORDER — METRONIDAZOLE 500 MG PO TABS
500.0000 mg | ORAL_TABLET | Freq: Two times a day (BID) | ORAL | 0 refills | Status: DC
  Filled 2022-11-07: qty 14, 7d supply, fill #0

## 2022-11-11 ENCOUNTER — Ambulatory Visit (HOSPITAL_BASED_OUTPATIENT_CLINIC_OR_DEPARTMENT_OTHER): Payer: Commercial Managed Care - PPO | Admitting: Cardiovascular Disease

## 2022-11-11 DIAGNOSIS — G4733 Obstructive sleep apnea (adult) (pediatric): Secondary | ICD-10-CM | POA: Diagnosis not present

## 2022-11-12 ENCOUNTER — Other Ambulatory Visit (HOSPITAL_COMMUNITY): Payer: Self-pay

## 2022-12-11 DIAGNOSIS — G4733 Obstructive sleep apnea (adult) (pediatric): Secondary | ICD-10-CM | POA: Diagnosis not present

## 2022-12-17 ENCOUNTER — Other Ambulatory Visit (HOSPITAL_COMMUNITY): Payer: Self-pay

## 2022-12-17 DIAGNOSIS — H16223 Keratoconjunctivitis sicca, not specified as Sjogren's, bilateral: Secondary | ICD-10-CM | POA: Diagnosis not present

## 2022-12-17 MED ORDER — XIIDRA 5 % OP SOLN: 1.0000 [drp] | Freq: Two times a day (BID) | OPHTHALMIC | 6 refills | Status: DC

## 2022-12-17 MED ORDER — XIIDRA 5 % OP SOLN
1.0000 [drp] | Freq: Two times a day (BID) | OPHTHALMIC | 6 refills | Status: AC
  Filled 2022-12-17: qty 180, 90d supply, fill #0

## 2022-12-18 ENCOUNTER — Other Ambulatory Visit (HOSPITAL_COMMUNITY): Payer: Self-pay

## 2022-12-19 ENCOUNTER — Ambulatory Visit: Payer: 59 | Admitting: Cardiology

## 2022-12-25 ENCOUNTER — Other Ambulatory Visit (HOSPITAL_COMMUNITY): Payer: Self-pay

## 2022-12-30 ENCOUNTER — Other Ambulatory Visit (HOSPITAL_COMMUNITY): Payer: Self-pay

## 2023-01-08 ENCOUNTER — Telehealth: Payer: Self-pay | Admitting: *Deleted

## 2023-01-08 NOTE — Telephone Encounter (Signed)
Called patient concerning her cpap usage. Her download stopped on 10/08/22.

## 2023-01-11 DIAGNOSIS — G4733 Obstructive sleep apnea (adult) (pediatric): Secondary | ICD-10-CM | POA: Diagnosis not present

## 2023-01-12 ENCOUNTER — Ambulatory Visit: Payer: 59 | Admitting: Cardiology

## 2023-01-21 ENCOUNTER — Other Ambulatory Visit (HOSPITAL_COMMUNITY): Payer: Self-pay

## 2023-02-10 DIAGNOSIS — G4733 Obstructive sleep apnea (adult) (pediatric): Secondary | ICD-10-CM | POA: Diagnosis not present

## 2023-03-13 ENCOUNTER — Other Ambulatory Visit: Payer: Self-pay

## 2023-03-13 ENCOUNTER — Other Ambulatory Visit (HOSPITAL_COMMUNITY): Payer: Self-pay

## 2023-03-13 DIAGNOSIS — G4733 Obstructive sleep apnea (adult) (pediatric): Secondary | ICD-10-CM | POA: Diagnosis not present

## 2023-03-23 ENCOUNTER — Ambulatory Visit (HOSPITAL_COMMUNITY)
Admission: EM | Admit: 2023-03-23 | Discharge: 2023-03-23 | Disposition: A | Discharge status: Home / Self Care | Payer: 59 | Attending: Physician Assistant | Admitting: Physician Assistant

## 2023-03-23 ENCOUNTER — Ambulatory Visit (INDEPENDENT_AMBULATORY_CARE_PROVIDER_SITE_OTHER): Payer: 59

## 2023-03-23 ENCOUNTER — Other Ambulatory Visit: Payer: Self-pay

## 2023-03-23 ENCOUNTER — Encounter (HOSPITAL_COMMUNITY): Payer: Self-pay | Admitting: Emergency Medicine

## 2023-03-23 DIAGNOSIS — M19012 Primary osteoarthritis, left shoulder: Secondary | ICD-10-CM | POA: Diagnosis not present

## 2023-03-23 DIAGNOSIS — W009XXA Unspecified fall due to ice and snow, initial encounter: Secondary | ICD-10-CM | POA: Diagnosis not present

## 2023-03-23 DIAGNOSIS — M25512 Pain in left shoulder: Secondary | ICD-10-CM

## 2023-03-23 DIAGNOSIS — G8911 Acute pain due to trauma: Secondary | ICD-10-CM

## 2023-03-23 DIAGNOSIS — W19XXXA Unspecified fall, initial encounter: Secondary | ICD-10-CM

## 2023-03-23 DIAGNOSIS — M25562 Pain in left knee: Secondary | ICD-10-CM

## 2023-03-23 DIAGNOSIS — M25572 Pain in left ankle and joints of left foot: Secondary | ICD-10-CM | POA: Diagnosis not present

## 2023-03-23 DIAGNOSIS — M1712 Unilateral primary osteoarthritis, left knee: Secondary | ICD-10-CM | POA: Diagnosis not present

## 2023-03-23 DIAGNOSIS — S9032XA Contusion of left foot, initial encounter: Secondary | ICD-10-CM

## 2023-03-23 DIAGNOSIS — M79672 Pain in left foot: Secondary | ICD-10-CM | POA: Diagnosis not present

## 2023-03-23 MED ORDER — IBUPROFEN 800 MG PO TABS: 800.0000 mg | ORAL_TABLET | Freq: Three times a day (TID) | ORAL | 0 refills | Status: AC

## 2023-03-23 NOTE — ED Triage Notes (Signed)
 Patient reports she fell on ice yesterday.  Patient has pain in left knee, left ankle, and left shoulder.    Took tylenol today.

## 2023-03-23 NOTE — ED Provider Notes (Signed)
 [facility]    CSN: [id number] Arrival date & time: 03/23/23  1724      History   Chief Complaint Chief Complaint  Patient presents with   Fall    HPI Erica Ferguson is a 58 y.o. female.   Patient presents today with a 24-hour history of multiple musculoskeletal pain as result of injury.  Reports that she slipped on ice yesterday (03/22/2023) and landed on her left side.  She did not hit her head and denies any loss of consciousness, headache, dizziness, nausea, vomiting, amnesia surrounding event.  She does not take any blood thinning medication.  She reports pain is worse in her left knee which is currently rated 10 on a 0-10 pain scale, described as sharp, worse with ambulation, no alleviating factors identified.  She denies previous injury or surgery involving her knee.  Denies any numbness or paresthesias in her hands or feet.  She also reports left shoulder and left foot pain.  She has been taking Tylenol and applying ice without improvement of symptoms.    Past Medical History:  Diagnosis Date   Allergy    seasonal allergies   Angioedema    Exertional dyspnea 05/23/2022   Pure hypercholesterolemia 07/24/2022   Urticaria     Patient Active Problem List   Diagnosis Date Noted   Pure hypercholesterolemia 07/24/2022   Exertional dyspnea 05/23/2022   Bradycardia 01/07/2022   Primary hypertension 07/03/2021   OSA (obstructive sleep apnea) 06/28/2021   Morbid obesity with BMI of 45.0-49.9, adult (HCC) 03/20/2021   Acute cystitis with hematuria 08/22/2020   Prediabetes 08/22/2020   Elevated blood pressure reading in office without diagnosis of hypertension 08/22/2020   B12 deficiency 08/22/2020    Past Surgical History:  Procedure Laterality Date   BREAST REDUCTION SURGERY  1989   REDUCTION MAMMAPLASTY     WISDOM TOOTH EXTRACTION Bilateral 1993    OB History     Gravida  0   Para  0   Term  0   Preterm  0   AB  0   Living  0      SAB  0    IAB  0   Ectopic  0   Multiple  0   Live Births  0            Home Medications    Prior to Admission medications   Medication Sig Start Date End Date Taking? Authorizing Provider  ibuprofen  (ADVIL ) 800 MG tablet Take 1 tablet (800 mg total) by mouth 3 (three) times daily. 03/23/23  Yes Adan Baehr K, PA-C  budesonide -formoterol  (SYMBICORT ) 160-4.5 MCG/ACT inhaler Inhale 2 puffs into the lungs every 12 (twelve) hours. 07/25/22   Neda Jennet LABOR, MD  EPINEPHrine  0.3 MG/0.3ML SOSY Inject as directed. For acute onset of lip swelling and airway obstruction, inject 1 pen into thigh muscle, call 911 and note time of first injection.  Wait 5 minutes.  If symptoms have not completely resolved, give second injection in other thigh muscle and note the time of second injection. As needed    [provider]  fexofenadine (ALLEGRA) 180 MG tablet Take 180 mg by mouth daily.    [provider]  Lifitegrast  (XIIDRA ) 5 % SOLN Place 1 drop into both eyes 2 (two) times daily. 12/17/22     Lifitegrast  (XIIDRA ) 5 % SOLN Place 1 drop into both eyes 2 (two) times daily. 12/17/22     loteprednol  (LOTEMAX ) 0.5 % ophthalmic suspension Place  1 drop into each eye 3 times daily for 2 weeks 10/14/22     olmesartan -hydrochlorothiazide  (BENICAR  HCT) 40-25 MG tablet Take 1 tablet by mouth daily. 04/29/22   Raford Riggs, MD    Family History Family History  Problem Relation Age of Onset   Heart attack Mother    Kidney failure Mother    Hypertension Mother    Hypertension Father    Stroke Maternal Grandmother    Colon polyps Neg Hx    Colon cancer Neg Hx    Esophageal cancer Neg Hx    Rectal cancer Neg Hx    Stomach cancer Neg Hx     Social History Social History   Tobacco Use   Smoking status: Never   Smokeless tobacco: Never  Vaping Use   Vaping status: Never Used  Substance Use Topics   Alcohol use: Not Currently   Drug use: Never     Allergies   Pork  allergy   Review of Systems Review of Systems  Constitutional:  Positive for activity change. Negative for appetite change, fatigue and fever.  Eyes:  Negative for visual disturbance.  Respiratory:  Negative for cough and shortness of breath.   Cardiovascular:  Negative for chest pain.  Gastrointestinal:  Negative for abdominal pain, diarrhea, nausea and vomiting.  Musculoskeletal:  Positive for arthralgias and myalgias. Negative for back pain and neck pain.  Skin:  Negative for color change and wound.  Neurological:  Negative for dizziness, weakness, light-headedness, numbness and headaches.     Physical Exam Triage Vital Signs ED Triage Vitals  Encounter Vitals Group     BP 03/23/23 2019 139/83     Systolic BP Percentile --      Diastolic BP Percentile --      Pulse Rate 03/23/23 2019 (!) 57     Resp 03/23/23 2019 20     Temp 03/23/23 2019 97.8 F (36.6 C)     Temp Source 03/23/23 2019 Oral     SpO2 03/23/23 2019 96 %     Weight --      Height --      Head Circumference --      Peak Flow --      Pain Score 03/23/23 2015 6     Pain Loc --      Pain Education --      Exclude from Growth Chart --    No data found.  Updated Vital Signs BP 139/83 (BP Location: Right Arm)   Pulse (!) 57   Temp 97.8 F (36.6 C) (Oral)   Resp 20   SpO2 96%   Visual Acuity Right Eye Distance:   Left Eye Distance:   Bilateral Distance:    Right Eye Near:   Left Eye Near:    Bilateral Near:     Physical Exam Vitals reviewed.  Constitutional:      General: She is awake. She is not in acute distress.    Appearance: Normal appearance. She is well-developed. She is not ill-appearing.     Comments: Very pleasant female appears stated age in no acute distress sitting comfortably in exam room  HENT:     Head: Normocephalic and atraumatic. No raccoon eyes, Battle's sign or contusion.     Right Ear: Tympanic membrane, ear canal and external ear normal. No hemotympanum.     Left Ear:  Tympanic membrane, ear canal and external ear normal. No hemotympanum.     Nose: Nose normal.     Mouth/Throat:  Tongue: Tongue does not deviate from midline.     Pharynx: Uvula midline. No oropharyngeal exudate or posterior oropharyngeal erythema.  Eyes:     Extraocular Movements: Extraocular movements intact.     Pupils: Pupils are equal, round, and reactive to light.  Cardiovascular:     Rate and Rhythm: Normal rate and regular rhythm.     Heart sounds: Normal heart sounds, S1 normal and S2 normal. No murmur heard. Pulmonary:     Effort: Pulmonary effort is normal.     Breath sounds: Normal breath sounds. No wheezing, rhonchi or rales.     Comments: Clear to auscultation bilaterally Musculoskeletal:     Left shoulder: Swelling and bony tenderness present. No tenderness. Normal range of motion. Normal strength.     Cervical back: Normal range of motion and neck supple. No tenderness or bony tenderness. No spinous process tenderness or muscular tenderness.     Thoracic back: No tenderness or bony tenderness.     Lumbar back: No tenderness or bony tenderness.     Left knee: No swelling. Decreased range of motion. Tenderness present over the medial joint line. No LCL laxity, MCL laxity, ACL laxity or PCL laxity.    Left ankle: No swelling. No tenderness. Normal range of motion. Anterior drawer test negative.     Left foot: Normal range of motion and normal capillary refill. Tenderness and bony tenderness present. No swelling.     Comments: Left shoulder: Moderate swelling at [facility] joint with associated tenderness.  Normal active range of motion with overhead flexion, internal and external rotation.  Negative empty can and drop arm.  Hand neurovascularly intact.  Left knee: Tenderness palpation over patella and inferior medial joint line.  No deformity noted.  Strength 5/5.  No ligamentous laxity on exam.  Left ankle/foot: No tenderness at medial or lateral malleoli.  Tenderness over first  and second metatarsal.  No deformity noted.  Foot neurovascularly intact.  Skin:    General: Skin is warm.     Findings: No rash.  Neurological:     General: No focal deficit present.     Mental Status: She is alert and oriented to person, place, and time.     Cranial Nerves: Cranial nerves 2-12 are intact.     Motor: Motor function is intact.     Coordination: Coordination is intact.     Gait: Gait is intact.  Psychiatric:        Behavior: Behavior is cooperative.      UC Treatments / Results  Labs (all labs ordered are listed, but only abnormal results are displayed) Labs Reviewed - No data to display  EKG   Radiology DG Shoulder Left Result Date: 03/23/2023 CLINICAL DATA:  Left shoulder pain after fall on ice yesterday. EXAM: LEFT SHOULDER - 2+ VIEW COMPARISON:  None Available. FINDINGS: Mild glenohumeral joint space narrowing. Mild inferior glenoid and humeral head-neck junction degenerative osteophytosis. Mild acromioclavicular joint space narrowing and peripheral osteophytosis. There is a 7 mm mineralized density just lateral to the humeral head. Mild distal lateral subacromial spurring. No acute fracture or dislocation. Moderate disc space narrowing endplate osteophytosis of the visualized thoracic spine. IMPRESSION: 1. Mild glenohumeral and acromioclavicular osteoarthritis. 2. Mild distal lateral subacromial spurring. 3. Mineralized density measuring 7 mm lateral to the superior aspect of the humeral head may represent chronic calcific tendinosis of the rotator cuff versus a loose body. Electronically Signed   By: Tanda Lyons M.D.   On: 03/23/2023 21:22  DG Foot Complete Left Result Date: 03/23/2023 CLINICAL DATA:  Pain after fall on ice yesterday.  Left ankle pain. EXAM: LEFT FOOT - COMPLETE 3+ VIEW COMPARISON:  None Available. FINDINGS: Moderate plantar calcaneal heel spur. Mild-to-moderate degenerative spurring at the anterior talar dome-neck junction. Mild dorsal second  tarsometatarsal degenerative spurring. Minimal lateral great toe metatarsophalangeal degenerative spurring. No acute fracture or dislocation. IMPRESSION: 1. No acute fracture. 2. Moderate plantar calcaneal heel spur. Electronically Signed   By: Tanda Lyons M.D.   On: 03/23/2023 21:20    Procedures Procedures (including critical care time)  Medications Ordered in UC Medications - No data to display  Initial Impression / Assessment and Plan / UC Course  I have reviewed the triage vital signs and the nursing notes.  Pertinent labs & imaging results that were available during my care of the patient were reviewed by me and considered in my medical decision making (see chart for details).     Patient is well-appearing, afebrile, nontoxic, nontachycardic.  No indication for head or neck CT based on [place] CT rules she denies any trauma involving her head or neck.  X-ray of left shoulder, left knee, left foot obtained given recent trauma and bony tenderness.  At the time of discharge her shoulder and foot x-ray had returned with no acute osseous abnormality but degenerative changes.  I reviewed her knee x-ray that showed no acute osseous abnormality based on my primary read but we were waiting for radiologist overread at the time of discharge.  We will contact her if the radiologist sees something different that changes our treatment plan.  She was placed in a brace for comfort and support and encouraged to use heat and gentle stretch for symptom relief.  She was started on ibuprofen  for pain relief and we discussed that she is not to take NSAIDs with this medication due to risk of GI bleeding.  She can use acetaminophen/Tylenol as needed for breakthrough pain.  Recommended close follow-up with orthopedics and was given contact information for local provider with instruction to call to schedule an appointment.  Discussed that if she has any worsening or changing symptoms she needs to be seen immediately.   Strict return precautions given.  Work excuse note provided.  Final Clinical Impressions(s) / UC Diagnoses   Final diagnoses:  Fall, initial encounter  Acute pain of left shoulder due to trauma  Acute pain of left knee  Contusion of left foot, initial encounter     Discharge Instructions      The x-ray of your foot and shoulder showed arthritis but no evidence of a broken bone.  I did not see anything on your knee and we will contact you if the radiologist sees something that I did not.  Use the brace for comfort and support.  Keep your knee elevated and avoid strenuous activity including lots of walking.  Take ibuprofen  for pain relief.  Do not take NSAIDs with this medication including aspirin, ibuprofen /Advil , naproxen/Aleve.  Follow-up with orthopedics; call to schedule an appointment.  If anything worsens or changes please return for reevaluation.     ED Prescriptions     Medication Sig Dispense Auth. Provider   ibuprofen  (ADVIL ) 800 MG tablet Take 1 tablet (800 mg total) by mouth 3 (three) times daily. 21 tablet Catheline Hixon K, PA-C      PDMP not reviewed this encounter.   Sherrell Rocky POUR, PA-C 03/23/23 2144

## 2023-03-23 NOTE — Discharge Instructions (Signed)
 The x-ray of your foot and shoulder showed arthritis but no evidence of a broken bone.  I did not see anything on your knee and we will contact you if the radiologist sees something that I did not.  Use the brace for comfort and support.  Keep your knee elevated and avoid strenuous activity including lots of walking.  Take ibuprofen  for pain relief.  Do not take NSAIDs with this medication including aspirin, ibuprofen /Advil , naproxen/Aleve.  Follow-up with orthopedics; call to schedule an appointment.  If anything worsens or changes please return for reevaluation.

## 2023-03-28 ENCOUNTER — Telehealth: Payer: 59 | Admitting: Nurse Practitioner

## 2023-03-28 ENCOUNTER — Other Ambulatory Visit (HOSPITAL_COMMUNITY): Payer: Self-pay

## 2023-03-28 DIAGNOSIS — R399 Unspecified symptoms and signs involving the genitourinary system: Secondary | ICD-10-CM

## 2023-03-28 MED ORDER — NITROFURANTOIN MONOHYD MACRO 100 MG PO CAPS
100.0000 mg | ORAL_CAPSULE | Freq: Two times a day (BID) | ORAL | 0 refills | Status: AC
Start: 2023-03-28 — End: 2023-04-02
  Filled 2023-03-28: qty 10, 5d supply, fill #0

## 2023-03-28 NOTE — Progress Notes (Signed)
Virtual Visit Consent   Erica Ferguson, you are scheduled for a virtual visit with a [facility] provider today. Just as with appointments in the office, your consent must be obtained to participate. Your consent will be active for this visit and any virtual visit you may have with one of our providers in the next 365 days. If you have a MyChart account, a copy of this consent can be sent to you electronically.  As this is a virtual visit, video technology does not allow for your provider to perform a traditional examination. This may limit your provider's ability to fully assess your condition. If your provider identifies any concerns that need to be evaluated in person or the need to arrange testing (such as labs, EKG, etc.), we will make arrangements to do so. Although advances in technology are sophisticated, we cannot ensure that it will always work on either your end or our end. If the connection with a video visit is poor, the visit may have to be switched to a telephone visit. With either a video or telephone visit, we are not always able to ensure that we have a secure connection.  By engaging in this virtual visit, you consent to the provision of healthcare and authorize for your insurance to be billed (if applicable) for the services provided during this visit. Depending on your insurance coverage, you may receive a charge related to this service.  I need to obtain your verbal consent now. Are you willing to proceed with your visit today? Erica Ferguson has provided verbal consent on 03/28/2023 for a virtual visit (video or telephone). Claiborne Rigg, NP  Date: 03/28/2023 10:19 AM  Virtual Visit via Video Note   I, Claiborne Rigg, connected with  Erica Ferguson  ([id number], [birth year]) on 03/28/23 at 10:15 AM EST by a video-enabled telemedicine application and verified that I am speaking with the correct person using two identifiers.  Location: Patient: Virtual Visit Location Patient:  Home Provider: Virtual Visit Location Provider: Home Office   I discussed the limitations of evaluation and management by telemedicine and the availability of in person appointments. The patient expressed understanding and agreed to proceed.    History of Present Illness: Erica Ferguson is a 58 y.o. who identifies as a female who was assigned female at birth, and is being seen today for UTI symptoms.  Believes her body wash has caused her to have a UTI. She is currently experiencing pain with urination and streaks of blood on tissue after urination. Denies flank pain, frequency, urgency or hesitancy.    Problems:  Patient Active Problem List   Diagnosis Date Noted   Pure hypercholesterolemia 07/24/2022   Exertional dyspnea 05/23/2022   Bradycardia 01/07/2022   Primary hypertension 07/03/2021   OSA (obstructive sleep apnea) 06/28/2021   Morbid obesity with BMI of 45.0-49.9, adult (HCC) 03/20/2021   Acute cystitis with hematuria 08/22/2020   Prediabetes 08/22/2020   Elevated blood pressure reading in office without diagnosis of hypertension 08/22/2020   B12 deficiency 08/22/2020    Allergies:  Allergies  Allergen Reactions   Pork Allergy Hives and Swelling   Medications:  Current Outpatient Medications:    nitrofurantoin, macrocrystal-monohydrate, (MACROBID) 100 MG capsule, Take 1 capsule (100 mg total) by mouth 2 (two) times daily for 5 days., Disp: 10 capsule, Rfl: 0   budesonide-formoterol (SYMBICORT) 160-4.5 MCG/ACT inhaler, Inhale 2 puffs into the lungs every 12 (twelve) hours., Disp: 10.2 g, Rfl: 12   EPINEPHrine  0.3 MG/0.3ML SOSY, Inject as directed. For acute onset of lip swelling and airway obstruction, inject 1 pen into thigh muscle, call 911 and note time of first injection.  Wait 5 minutes.  If symptoms have not completely resolved, give second injection in other thigh muscle and note the time of second injection. As needed, Disp: , Rfl:    fexofenadine (ALLEGRA) 180 MG  tablet, Take 180 mg by mouth daily., Disp: , Rfl:    ibuprofen (ADVIL) 800 MG tablet, Take 1 tablet (800 mg total) by mouth 3 (three) times daily., Disp: 21 tablet, Rfl: 0   Lifitegrast (XIIDRA) 5 % SOLN, Place 1 drop into both eyes 2 (two) times daily., Disp: 180 each, Rfl: 6   Lifitegrast (XIIDRA) 5 % SOLN, Place 1 drop into both eyes 2 (two) times daily., Disp: 180 each, Rfl: 6   loteprednol (LOTEMAX) 0.5 % ophthalmic suspension, Place 1 drop into each eye 3 times daily for 2 weeks, Disp: 5 mL, Rfl: 0   olmesartan-hydrochlorothiazide (BENICAR HCT) 40-25 MG tablet, Take 1 tablet by mouth daily., Disp: 90 tablet, Rfl: 3  Observations/Objective: Patient is well-developed, well-nourished in no acute distress.  Resting comfortably at home.  Head is normocephalic, atraumatic.  No labored breathing.  Speech is clear and coherent with logical content.  Patient is alert and oriented at baseline.    Assessment and Plan: 1. UTI symptoms (Primary) - nitrofurantoin, macrocrystal-monohydrate, (MACROBID) 100 MG capsule; Take 1 capsule (100 mg total) by mouth 2 (two) times daily for 5 days.  Dispense: 10 capsule; Refill: 0   Follow Up Instructions: I discussed the assessment and treatment plan with the patient. The patient was provided an opportunity to ask questions and all were answered. The patient agreed with the plan and demonstrated an understanding of the instructions.  A copy of instructions were sent to the patient via MyChart unless otherwise noted below.    The patient was advised to call back or seek an in-person evaluation if the symptoms worsen or if the condition fails to improve as anticipated.    Claiborne Rigg, NP

## 2023-03-28 NOTE — Patient Instructions (Signed)
  Erica Ferguson, thank you for joining Claiborne Rigg, NP for today's virtual visit.  While this provider is not your primary care provider (PCP), if your PCP is located in our provider database this encounter information will be shared with them immediately following your visit.   A [facility] MyChart account gives you access to today's visit and all your visits, tests, and labs performed at [facility] " click here if you don't have a [facility] MyChart account or go to [email or web address]  Consent: (Patient) Erica Ferguson provided verbal consent for this virtual visit at the beginning of the encounter.  Current Medications:  Current Outpatient Medications:    nitrofurantoin, macrocrystal-monohydrate, (MACROBID) 100 MG capsule, Take 1 capsule (100 mg total) by mouth 2 (two) times daily for 5 days., Disp: 10 capsule, Rfl: 0   budesonide-formoterol (SYMBICORT) 160-4.5 MCG/ACT inhaler, Inhale 2 puffs into the lungs every 12 (twelve) hours., Disp: 10.2 g, Rfl: 12   EPINEPHrine 0.3 MG/0.3ML SOSY, Inject as directed. For acute onset of lip swelling and airway obstruction, inject 1 pen into thigh muscle, call 911 and note time of first injection.  Wait 5 minutes.  If symptoms have not completely resolved, give second injection in other thigh muscle and note the time of second injection. As needed, Disp: , Rfl:    fexofenadine (ALLEGRA) 180 MG tablet, Take 180 mg by mouth daily., Disp: , Rfl:    ibuprofen (ADVIL) 800 MG tablet, Take 1 tablet (800 mg total) by mouth 3 (three) times daily., Disp: 21 tablet, Rfl: 0   Lifitegrast (XIIDRA) 5 % SOLN, Place 1 drop into both eyes 2 (two) times daily., Disp: 180 each, Rfl: 6   Lifitegrast (XIIDRA) 5 % SOLN, Place 1 drop into both eyes 2 (two) times daily., Disp: 180 each, Rfl: 6   loteprednol (LOTEMAX) 0.5 % ophthalmic suspension, Place 1 drop into each eye 3 times daily for 2 weeks, Disp: 5 mL, Rfl: 0   olmesartan-hydrochlorothiazide  (BENICAR HCT) 40-25 MG tablet, Take 1 tablet by mouth daily., Disp: 90 tablet, Rfl: 3   Medications ordered in this encounter:  Meds ordered this encounter  Medications   nitrofurantoin, macrocrystal-monohydrate, (MACROBID) 100 MG capsule    Sig: Take 1 capsule (100 mg total) by mouth 2 (two) times daily for 5 days.    Dispense:  10 capsule    Refill:  0    Supervising Provider:   Merrilee Jansky [[id number]]     *If you need refills on other medications prior to your next appointment, please contact your pharmacy*  Follow-Up: Call back or seek an in-person evaluation if the symptoms worsen or if the condition fails to improve as anticipated.  [facility] [phone]   If you have been instructed to have an in-person evaluation today at a local Urgent Care facility, please use the link below. It will take you to a list of all of our available [facility], including address, phone number and hours of operation. Please do not delay care.  [facility]  If you or a family member do not have a primary care provider, use the link below to schedule a visit and establish care. When you choose a [facility] primary care physician or advanced practice provider, you gain a long-term partner in health. Find a Primary Care Provider  Learn more about [facility]'s in-office and virtual care options: [facility] - Get Care Now

## 2023-03-30 ENCOUNTER — Telehealth: Payer: 59 | Admitting: Nurse Practitioner

## 2023-03-30 DIAGNOSIS — B029 Zoster without complications: Secondary | ICD-10-CM

## 2023-03-30 MED ORDER — VALACYCLOVIR HCL 1 G PO TABS
1000.0000 mg | ORAL_TABLET | Freq: Three times a day (TID) | ORAL | 0 refills | Status: AC
Start: 2023-03-30 — End: 2023-04-07
  Filled 2023-03-30: qty 21, 7d supply, fill #0

## 2023-03-30 MED ORDER — GABAPENTIN 300 MG PO CAPS
300.0000 mg | ORAL_CAPSULE | Freq: Two times a day (BID) | ORAL | 0 refills | Status: AC | PRN
Start: 2023-03-30 — End: 2023-04-30
  Filled 2023-03-30: qty 60, 30d supply, fill #0

## 2023-03-30 NOTE — Progress Notes (Signed)
Virtual Visit Consent   Erica Ferguson, you are scheduled for a virtual visit with a [facility] provider today. Just as with appointments in the office, your consent must be obtained to participate. Your consent will be active for this visit and any virtual visit you may have with one of our providers in the next 365 days. If you have a MyChart account, a copy of this consent can be sent to you electronically.  As this is a virtual visit, video technology does not allow for your provider to perform a traditional examination. This may limit your provider's ability to fully assess your condition. If your provider identifies any concerns that need to be evaluated in person or the need to arrange testing (such as labs, EKG, etc.), we will make arrangements to do so. Although advances in technology are sophisticated, we cannot ensure that it will always work on either your end or our end. If the connection with a video visit is poor, the visit may have to be switched to a telephone visit. With either a video or telephone visit, we are not always able to ensure that we have a secure connection.  By engaging in this virtual visit, you consent to the provision of healthcare and authorize for your insurance to be billed (if applicable) for the services provided during this visit. Depending on your insurance coverage, you may receive a charge related to this service.  I need to obtain your verbal consent now. Are you willing to proceed with your visit today? Erica Ferguson has provided verbal consent on 03/30/2023 for a virtual visit (video or telephone). Erica Simas, FNP  Date: 03/30/2023 7:29 PM  Virtual Visit via Video Note   I, Erica Ferguson, connected with  Erica Ferguson  ([id number], 60-28-1967) on 03/30/23 at  7:30 PM EST by a video-enabled telemedicine application and verified that I am speaking with the correct person using two identifiers.  Location: Patient: Virtual Visit Location Patient:  Home Provider: Virtual Visit Location Provider: Home Office   I discussed the limitations of evaluation and management by telemedicine and the availability of in person appointments. The patient expressed understanding and agreed to proceed.    History of Present Illness: Erica Ferguson is a 58 y.o. who identifies as a female who was assigned female at birth, and is being seen today for irritation to her back that has become painful with a cluster of   "bubbled lesions"   She had a family friend look at her back and realized that it was a rash that appears as shingles.   Symptom onset was about one week ago though she feels that the symptoms are progressing and worsening- started as irritation then became painful rash was noted yesterday   Pain and rash is Left mid upper back   She has not had shingles in the past  Has not been vaccinated   She is currently being treated for a UTI  Denies any other systemic symptoms    Problems:  Patient Active Problem List   Diagnosis Date Noted   Pure hypercholesterolemia 07/24/2022   Exertional dyspnea 05/23/2022   Bradycardia 01/07/2022   Primary hypertension 07/03/2021   OSA (obstructive sleep apnea) 06/28/2021   Morbid obesity with BMI of 45.0-49.9, adult (HCC) 03/20/2021   Acute cystitis with hematuria 08/22/2020   Prediabetes 08/22/2020   Elevated blood pressure reading in office without diagnosis of hypertension 08/22/2020   B12 deficiency 08/22/2020    Allergies:  Allergies  Allergen Reactions   Pork Allergy Hives and Swelling   Medications:  Current Outpatient Medications:    budesonide-formoterol (SYMBICORT) 160-4.5 MCG/ACT inhaler, Inhale 2 puffs into the lungs every 12 (twelve) hours., Disp: 10.2 g, Rfl: 12   EPINEPHrine 0.3 MG/0.3ML SOSY, Inject as directed. For acute onset of lip swelling and airway obstruction, inject 1 pen into thigh muscle, call 911 and note time of first injection.  Wait 5 minutes.  If symptoms have not  completely resolved, give second injection in other thigh muscle and note the time of second injection. As needed, Disp: , Rfl:    fexofenadine (ALLEGRA) 180 MG tablet, Take 180 mg by mouth daily., Disp: , Rfl:    ibuprofen (ADVIL) 800 MG tablet, Take 1 tablet (800 mg total) by mouth 3 (three) times daily., Disp: 21 tablet, Rfl: 0   Lifitegrast (XIIDRA) 5 % SOLN, Place 1 drop into both eyes 2 (two) times daily., Disp: 180 each, Rfl: 6   Lifitegrast (XIIDRA) 5 % SOLN, Place 1 drop into both eyes 2 (two) times daily., Disp: 180 each, Rfl: 6   loteprednol (LOTEMAX) 0.5 % ophthalmic suspension, Place 1 drop into each eye 3 times daily for 2 weeks, Disp: 5 mL, Rfl: 0   nitrofurantoin, macrocrystal-monohydrate, (MACROBID) 100 MG capsule, Take 1 capsule (100 mg total) by mouth 2 (two) times daily for 5 days., Disp: 10 capsule, Rfl: 0   olmesartan-hydrochlorothiazide (BENICAR HCT) 40-25 MG tablet, Take 1 tablet by mouth daily., Disp: 90 tablet, Rfl: 3  Observations/Objective: Patient is well-developed, well-nourished in no acute distress.  Resting comfortably  at home.  Head is normocephalic, atraumatic.  No labored breathing.  Speech is clear and coherent with logical content.  Patient is alert and oriented at baseline.  Rash not visualized on camera   Assessment and Plan:  1. Herpes zoster without complication (Primary)  - valACYclovir (VALTREX) 1000 MG tablet; Take 1 tablet (1,000 mg total) by mouth 3 (three) times daily for 7 days.  Dispense: 21 tablet; Refill: 0  - gabapentin (NEURONTIN) 300 MG capsule; Take 1 capsule (300 mg total) by mouth 2 (two) times daily as needed (pain).  Dispense: 60 capsule; Refill: 0    Discussed contagiousness with patient   Follow Up Instructions: I discussed the assessment and treatment plan with the patient. The patient was provided an opportunity to ask questions and all were answered. The patient agreed with the plan and demonstrated an understanding of the  instructions.  A copy of instructions were sent to the patient via MyChart unless otherwise noted below.    The patient was advised to call back or seek an in-person evaluation if the symptoms worsen or if the condition fails to improve as anticipated.    Erica Simas, FNP

## 2023-03-31 ENCOUNTER — Other Ambulatory Visit (HOSPITAL_COMMUNITY): Payer: Self-pay

## 2023-04-13 DIAGNOSIS — G4733 Obstructive sleep apnea (adult) (pediatric): Secondary | ICD-10-CM | POA: Diagnosis not present

## 2023-04-28 ENCOUNTER — Other Ambulatory Visit (HOSPITAL_COMMUNITY): Payer: Self-pay

## 2023-04-28 DIAGNOSIS — N950 Postmenopausal bleeding: Secondary | ICD-10-CM | POA: Diagnosis not present

## 2023-04-28 DIAGNOSIS — Z1339 Encounter for screening examination for other mental health and behavioral disorders: Secondary | ICD-10-CM | POA: Diagnosis not present

## 2023-04-28 DIAGNOSIS — Z1239 Encounter for other screening for malignant neoplasm of breast: Secondary | ICD-10-CM | POA: Diagnosis not present

## 2023-04-28 DIAGNOSIS — Z124 Encounter for screening for malignant neoplasm of cervix: Secondary | ICD-10-CM | POA: Diagnosis not present

## 2023-04-28 DIAGNOSIS — D259 Leiomyoma of uterus, unspecified: Secondary | ICD-10-CM | POA: Diagnosis not present

## 2023-04-28 MED ORDER — NORETHINDRONE ACETATE 5 MG PO TABS
5.0000 mg | ORAL_TABLET | Freq: Every day | ORAL | 0 refills | Status: DC
  Filled 2023-04-28: qty 7, 7d supply, fill #0

## 2023-04-29 ENCOUNTER — Other Ambulatory Visit: Payer: Self-pay | Admitting: Obstetrics and Gynecology

## 2023-04-29 DIAGNOSIS — Z1231 Encounter for screening mammogram for malignant neoplasm of breast: Secondary | ICD-10-CM

## 2023-05-06 DIAGNOSIS — R9389 Abnormal findings on diagnostic imaging of other specified body structures: Secondary | ICD-10-CM | POA: Diagnosis not present

## 2023-05-06 DIAGNOSIS — N950 Postmenopausal bleeding: Secondary | ICD-10-CM | POA: Diagnosis not present

## 2023-05-11 ENCOUNTER — Other Ambulatory Visit (HOSPITAL_COMMUNITY): Payer: Self-pay

## 2023-05-11 MED ORDER — NORETHINDRONE ACETATE 5 MG PO TABS
ORAL_TABLET | ORAL | 1 refills | Status: DC
  Filled 2023-05-11: qty 28, 21d supply, fill #0

## 2023-05-19 ENCOUNTER — Ambulatory Visit
Admission: RE | Admit: 2023-05-19 | Discharge: 2023-05-19 | Disposition: A | Discharge status: Home / Self Care | Payer: 59 | Source: Ambulatory Visit | Attending: Obstetrics and Gynecology | Admitting: Obstetrics and Gynecology

## 2023-05-19 DIAGNOSIS — Z1231 Encounter for screening mammogram for malignant neoplasm of breast: Secondary | ICD-10-CM

## 2023-05-21 ENCOUNTER — Other Ambulatory Visit (HOSPITAL_COMMUNITY): Payer: Self-pay

## 2023-06-04 ENCOUNTER — Other Ambulatory Visit (HOSPITAL_BASED_OUTPATIENT_CLINIC_OR_DEPARTMENT_OTHER): Payer: Self-pay | Admitting: Cardiovascular Disease

## 2023-06-05 ENCOUNTER — Other Ambulatory Visit (HOSPITAL_COMMUNITY): Payer: Self-pay

## 2023-06-05 ENCOUNTER — Other Ambulatory Visit: Payer: Self-pay

## 2023-06-05 MED ORDER — OLMESARTAN MEDOXOMIL-HCTZ 40-25 MG PO TABS
1.0000 | ORAL_TABLET | Freq: Every day | ORAL | 0 refills | Status: DC
  Filled 2023-06-05: qty 90, 90d supply, fill #0

## 2023-09-16 ENCOUNTER — Other Ambulatory Visit (HOSPITAL_BASED_OUTPATIENT_CLINIC_OR_DEPARTMENT_OTHER): Payer: Self-pay | Admitting: Cardiovascular Disease

## 2023-09-17 ENCOUNTER — Other Ambulatory Visit (HOSPITAL_COMMUNITY): Payer: Self-pay

## 2023-09-17 MED ORDER — OLMESARTAN MEDOXOMIL-HCTZ 40-25 MG PO TABS
1.0000 | ORAL_TABLET | Freq: Every day | ORAL | 0 refills | Status: DC
  Filled 2023-09-17: qty 90, 90d supply, fill #0

## 2023-11-03 ENCOUNTER — Other Ambulatory Visit (HOSPITAL_COMMUNITY): Payer: Self-pay

## 2023-11-03 MED ORDER — ALBUTEROL SULFATE HFA 108 (90 BASE) MCG/ACT IN AERS
1.0000 | INHALATION_SPRAY | RESPIRATORY_TRACT | 2 refills | Status: AC | PRN
  Filled 2023-11-03: qty 6.7, 17d supply, fill #0

## 2023-11-05 ENCOUNTER — Other Ambulatory Visit (HOSPITAL_COMMUNITY): Payer: Self-pay

## 2023-11-05 MED ORDER — GLIPIZIDE 5 MG PO TABS
5.0000 mg | ORAL_TABLET | Freq: Two times a day (BID) | ORAL | 0 refills | Status: DC
  Filled 2023-11-05: qty 180, 90d supply, fill #0

## 2023-11-14 ENCOUNTER — Telehealth: Admitting: Family

## 2023-11-14 DIAGNOSIS — J209 Acute bronchitis, unspecified: Secondary | ICD-10-CM

## 2023-11-14 DIAGNOSIS — R059 Cough, unspecified: Secondary | ICD-10-CM | POA: Diagnosis not present

## 2023-11-14 MED ORDER — CETIRIZINE HCL 10 MG PO TABS
10.0000 mg | ORAL_TABLET | Freq: Every day | ORAL | 1 refills | Status: AC
Start: 2023-11-14 — End: ?
  Filled 2023-11-14: qty 90, 90d supply, fill #0

## 2023-11-14 MED ORDER — FLUTICASONE PROPIONATE 50 MCG/ACT NA SUSP
2.0000 | Freq: Every day | NASAL | 6 refills | Status: AC
Start: 2023-11-14 — End: ?
  Filled 2023-11-14: qty 16, 30d supply, fill #0

## 2023-11-14 MED ORDER — PROMETHAZINE-DM 6.25-15 MG/5ML PO SYRP
5.0000 mL | ORAL_SOLUTION | Freq: Three times a day (TID) | ORAL | 0 refills | Status: AC | PRN
Start: 2023-11-14 — End: ?
  Filled 2023-11-14: qty 118, 8d supply, fill #0

## 2023-11-14 MED ORDER — BENZONATATE 200 MG PO CAPS
200.0000 mg | ORAL_CAPSULE | Freq: Two times a day (BID) | ORAL | 0 refills | Status: AC | PRN
Start: 2023-11-14 — End: ?
  Filled 2023-11-14: qty 20, 10d supply, fill #0

## 2023-11-14 NOTE — Progress Notes (Signed)
 Virtual Visit Consent   Erica Ferguson, you are scheduled for a virtual visit with a [facility] provider today. Just as with appointments in the office, your consent must be obtained to participate. Your consent will be active for this visit and any virtual visit you may have with one of our providers in the next 365 days. If you have a MyChart account, a copy of this consent can be sent to you electronically.  As this is a virtual visit, video technology does not allow for your provider to perform a traditional examination. This may limit your provider's ability to fully assess your condition. If your provider identifies any concerns that need to be evaluated in person or the need to arrange testing (such as labs, EKG, etc.), we will make arrangements to do so. Although advances in technology are sophisticated, we cannot ensure that it will always work on either your end or our end. If the connection with a video visit is poor, the visit may have to be switched to a telephone visit. With either a video or telephone visit, we are not always able to ensure that we have a secure connection.  By engaging in this virtual visit, you consent to the provision of healthcare and authorize for your insurance to be billed (if applicable) for the services provided during this visit. Depending on your insurance coverage, you may receive a charge related to this service.  I need to obtain your verbal consent now. Are you willing to proceed with your visit today? Erica Ferguson has provided verbal consent on 11/14/2023 for a virtual visit (video or telephone). Bari Learn, FNP  Date: 11/14/2023 4:35 PM   Virtual Visit via Video Note   I, Bari Learn, connected with  Erica Ferguson  ([id number], [birth year]) on 11/14/23 at  4:15 PM EDT by a video-enabled telemedicine application and verified that I am speaking with the correct person using two identifiers.  Location: Patient: Virtual Visit Location Patient:  Home Provider: Virtual Visit Location Provider: Home Office   I discussed the limitations of evaluation and management by telemedicine and the availability of in person appointments. The patient expressed understanding and agreed to proceed.    History of Present Illness: Erica Ferguson is a 58 y.o. who identifies as a female who was assigned female at birth, and is being seen today for cough that started yesterday.  HPI: Cough This is a new problem. The current episode started yesterday. The problem has been waxing and waning. The problem occurs every few minutes. The cough is Non-productive. Pertinent negatives include no chills, ear congestion, ear pain, fever, headaches, myalgias, shortness of breath or wheezing. The symptoms are aggravated by lying down. She has tried rest and a beta-agonist inhaler for the symptoms.    Problems:  Patient Active Problem List   Diagnosis Date Noted   Pure hypercholesterolemia 07/24/2022   Exertional dyspnea 05/23/2022   Bradycardia 01/07/2022   Primary hypertension 07/03/2021   OSA (obstructive sleep apnea) 06/28/2021   Morbid obesity with BMI of 45.0-49.9, adult (HCC) 03/20/2021   Prediabetes 08/22/2020   Elevated blood pressure reading in office without diagnosis of hypertension 08/22/2020   B12 deficiency 08/22/2020    Allergies:  Allergies  Allergen Reactions   Pork Allergy Hives and Swelling   Medications:  Current Outpatient Medications:    benzonatate  (TESSALON ) 200 MG capsule, Take 1 capsule (200 mg total) by mouth 2 (two) times daily as needed for cough., Disp: 20 capsule, Rfl:  0   cetirizine  (ZYRTEC  ALLERGY) 10 MG tablet, Take 1 tablet (10 mg total) by mouth daily., Disp: 90 tablet, Rfl: 1   fluticasone  (FLONASE ) 50 MCG/ACT nasal spray, Place 2 sprays into both nostrils daily., Disp: 16 g, Rfl: 6   promethazine -dextromethorphan (PROMETHAZINE -DM) 6.25-15 MG/5ML syrup, Take 5 mLs by mouth 3 (three) times daily as needed for cough., Disp:  118 mL, Rfl: 0   albuterol  (VENTOLIN  HFA) 108 (90 Base) MCG/ACT inhaler, Inhale 1-2 puffs into the lungs every 4-6 hours as needed, Disp: 6.7 g, Rfl: 2   budesonide -formoterol  (SYMBICORT ) 160-4.5 MCG/ACT inhaler, Inhale 2 puffs into the lungs every 12 (twelve) hours., Disp: 10.2 g, Rfl: 12   EPINEPHrine  0.3 MG/0.3ML SOSY, Inject as directed. For acute onset of lip swelling and airway obstruction, inject 1 pen into thigh muscle, call 911 and note time of first injection.  Wait 5 minutes.  If symptoms have not completely resolved, give second injection in other thigh muscle and note the time of second injection. As needed, Disp: , Rfl:    fexofenadine (ALLEGRA) 180 MG tablet, Take 180 mg by mouth daily., Disp: , Rfl:    gabapentin  (NEURONTIN ) 300 MG capsule, Take 1 capsule (300 mg total) by mouth 2 (two) times daily as needed (pain)., Disp: 60 capsule, Rfl: 0   glipiZIDE  (GLUCOTROL ) 5 MG tablet, Take 1 tablet (5 mg) by mouth once a day for one week.  Then increase to 1 tablet (5 mg) by mouth twice a day., Disp: 180 tablet, Rfl: 0   ibuprofen  (ADVIL ) 800 MG tablet, Take 1 tablet (800 mg total) by mouth 3 (three) times daily., Disp: 21 tablet, Rfl: 0   Lifitegrast  (XIIDRA ) 5 % SOLN, Place 1 drop into both eyes 2 (two) times daily., Disp: 180 each, Rfl: 6   olmesartan -hydrochlorothiazide  (BENICAR  HCT) 40-25 MG tablet, Take 1 tablet by mouth daily., Disp: 90 tablet, Rfl: 0  Observations/Objective: Patient is well-developed, well-nourished in no acute distress.  Resting comfortably  at home.  Head is normocephalic, atraumatic.  No labored breathing.  Speech is clear and coherent with logical content.  Patient is alert and oriented at baseline.  Dry tight nonproductive   Assessment and Plan: 1. Cough, unspecified type (Primary)  2. Acute bronchitis, unspecified organism - benzonatate  (TESSALON ) 200 MG capsule; Take 1 capsule (200 mg total) by mouth 2 (two) times daily as needed for cough.  Dispense:  20 capsule; Refill: 0 - promethazine -dextromethorphan (PROMETHAZINE -DM) 6.25-15 MG/5ML syrup; Take 5 mLs by mouth 3 (three) times daily as needed for cough.  Dispense: 118 mL; Refill: 0 - fluticasone  (FLONASE ) 50 MCG/ACT nasal spray; Place 2 sprays into both nostrils daily.  Dispense: 16 g; Refill: 6 - cetirizine  (ZYRTEC  ALLERGY) 10 MG tablet; Take 1 tablet (10 mg total) by mouth daily.  Dispense: 90 tablet; Refill: 1  - Take meds as prescribed - Use a cool mist humidifier  -Use saline nose sprays frequently -Force fluids -For any cough or congestion  Use plain Mucinex- regular strength or max strength is fine -For fever or aces or pains- take tylenol or ibuprofen . -Throat lozenges if help -Follow up if symptoms worsen or do not improve   Follow Up Instructions: I discussed the assessment and treatment plan with the patient. The patient was provided an opportunity to ask questions and all were answered. The patient agreed with the plan and demonstrated an understanding of the instructions.  A copy of instructions were sent to the patient via MyChart unless otherwise noted  below.     The patient was advised to call back or seek an in-person evaluation if the symptoms worsen or if the condition fails to improve as anticipated.    Bari Learn, FNP

## 2023-11-14 NOTE — Patient Instructions (Signed)

## 2023-11-15 ENCOUNTER — Other Ambulatory Visit (HOSPITAL_COMMUNITY): Payer: Self-pay

## 2023-12-25 ENCOUNTER — Other Ambulatory Visit (HOSPITAL_BASED_OUTPATIENT_CLINIC_OR_DEPARTMENT_OTHER): Payer: Self-pay | Admitting: Cardiovascular Disease

## 2023-12-29 ENCOUNTER — Other Ambulatory Visit (HOSPITAL_COMMUNITY): Payer: Self-pay

## 2023-12-29 MED ORDER — OLMESARTAN MEDOXOMIL-HCTZ 40-25 MG PO TABS
1.0000 | ORAL_TABLET | Freq: Every day | ORAL | 0 refills | Status: DC
  Filled 2023-12-29: qty 30, 30d supply, fill #0

## 2024-01-22 ENCOUNTER — Other Ambulatory Visit (HOSPITAL_COMMUNITY): Payer: Self-pay

## 2024-01-22 MED ORDER — ALBUTEROL SULFATE HFA 108 (90 BASE) MCG/ACT IN AERS
2.0000 | INHALATION_SPRAY | RESPIRATORY_TRACT | 2 refills | Status: AC
  Filled 2024-01-22: qty 6.7, 20d supply, fill #0

## 2024-01-22 MED ORDER — OLMESARTAN MEDOXOMIL-HCTZ 40-25 MG PO TABS
1.0000 | ORAL_TABLET | Freq: Every day | ORAL | 0 refills | Status: AC
  Filled 2024-01-22: qty 90, 90d supply, fill #0

## 2024-01-22 MED ORDER — GLIPIZIDE 5 MG PO TABS
5.0000 mg | ORAL_TABLET | Freq: Two times a day (BID) | ORAL | 0 refills | Status: AC
  Filled 2024-01-22: qty 180, 90d supply, fill #0

## 2024-01-23 ENCOUNTER — Other Ambulatory Visit (HOSPITAL_COMMUNITY): Payer: Self-pay

## 2024-01-28 ENCOUNTER — Other Ambulatory Visit (HOSPITAL_COMMUNITY): Payer: Self-pay

## 2024-02-11 NOTE — Unmapped External Note (Signed)
 Assessment Note   Demographics Verification - Call Monitoring - Confidentiality      Member Verification: Member Verification    Call Monitoring Disclaimer: Call Monitoring Disclaimer         Person Providing Info on the Call   Who is the person providing the information on the call? Member      Limitations/Preferences   What, if any, physical limitations, health literacy, language needs and learning preferences do you have that I should be aware of when we talk? Health Literacy- No Limitations, Language- No Limitations, Learning Preferences- No Preference, Physical- No Limitations      Specify other language limitations        Specify other physical limitations        LCC Contact Type   [facility] Your Way contact type: 3rd contact-Telephonic Lifestyle Coaching           Lifestyle Coaching Healthy Eating Follow Up                    General Health Perception   How would you describe your health in general? My health is poor      SMART GOAL & SMART GOAL Follow Up     SMART GOAL Mbr. will focus on her 30 day challenge (no eating after 8, water intake 64oz, no fast food, walking 15 minutes a day)  through 03/09/24 and check in next call on success.   Confidence Level: 1  Barriers (if less than 8): none   (If confidence number is less than 8): has to complete it to measure her confidence  Did the member set a SMART goal? Yes  Did the member meet their previous SMART goal?  Yes      Brief Summary of Member Status   What medical conditions has your doctor diagnosed you with?    Past Medical History:  Diagnosis Date   Diabetes mellitus (CMS/HCC)    Hypertension      Did the member score via Care Engine for any condition(s) that he/she did NOT confirm?    HYW:  What medical conditions are you most concerned about and why?    In between MD visits people often find a need to go to the ER or an Urgent Care facilitycan you  tell me if you had any concerns or issues in which you went to an ER or Urgent care in the last 12 months?      Condition Specific Metrics   Height/Weight/BMI  / /    Blood Pressure    Outcome Metrics      Medications   Current Medications[1]    There are no discontinued medications.     Medication Review         Depression Screening PHQ2/PHQ9 Exclusions   PHQ2 EXCLUSION CRITERIA:  Do NOT administer the PHQ-2 if any of the following apply to this member.   PHQ9 EXCLUSION CRITERIA: To determine if this assessment is right for you, I need to ask you some questions before we start.  Besides depression, have you been diagnosed with any other mental health issues such as: No applicable exclusions              Depression PHQ2/PHQ9 Screening Results     PHQ2  The PHQ-2 questions are scored from 0 (not at all) to 3 (nearly every day) and then added together: Score 0-2 = Not likely to be at risk for depression, Score 3-6 = At risk for  depression (further screening recommended).        PHQ9 PHQ-9 Mini Module-If diagnosis of depression- show confirmed dx of depression and show PHQ-9 score & score description        Social Drivers of Health   Transportation Needs: No Transportation Needs (04/29/2022)   [facility] - [facility]    Lack of Transportation (Medical): No    Lack of Transportation (Non-Medical): No  Food Insecurity: No Food Insecurity (04/29/2022)   [facility] from [facility]   Hunger Vital Sign    Within the past 12 months, you worried that your food would run out before you got the money to buy more.: Never true    Within the past 12 months, the food you bought just didn't last and you didn't have money to get more.: Never true  Alcohol Use: Not At Risk (09/11/2022)   Received from [facility]   Alcohol Usage AUDIT-10    Alcohol Use AUDIT-10: Not on file  Tobacco Use: Low Risk (12/02/2023)   Patient History     Smoking Tobacco Use: Never    Smokeless Tobacco Use: Never  Physical Activity: Inactive (04/29/2022)   [facility]   Exercise Vital Sign    On average, how many days per week do you engage in moderate to strenuous exercise (like a brisk walk)?: 0 days    On average, how many minutes do you engage in exercise at this level?: 0 min      Lab Results   Results     There are no results available from this visit.         Immunizations   Immunizations Mini Module (show all Q/A pairs answered during this encounter)       Intervention/Actions   Education Topics Discussed      Referrals & Referral Follow Up          MEP/Mobile App Registration & Education on Tools/Resources   Is the member registered on the Member Engagement Platform (MEP) or Mobile App?  Explain tools and resources available on Member Engagement Platform [facility]) and how to get the most benefit out of them.  Indicate resources reviewed with member: Yes-registered on [facility] Yes-registered on mobile app 07/18/2023    Goal setting    Next Scheduled Appointment      Date Provider Department Visit Type   03/07/2024 5:00 PM Ellouise Novak Care Management [facility] Nurse Follow Up Assessment- 1st Attempt   03/24/2024 4:30 PM Almarie Chase Care Management [facility] Coach Follow Up Assmt- 1st Attempt         Follow Up Needs Identified   Lifestyle Coaching Addendum: Account: [facility] Account Phone Number: [phone] Member Name: Erica Ferguson Time Zone: PC[]  MT[]  CT[]  ET[x]  CALLTYPE: FOLLOW UP CALL   SDOH factors affecting member's health/goals: YESNO: YES  Next PHQ2:06/23/2024  Health Conditions per Member:  has a past medical history of Diabetes mellitus (CMS/HCC) and Hypertension.  She has no past medical history of High cholesterol.  Member Concerns (Focus/Agenda of Call): CALLTOPIC: NUTRITION  Mbr. states: Mbr said things were going well til Thanksgiving. Looking at  different recipes for whole food cooking. Not eating after 8pm. She and sister are doing a challenge for 30 days - 4 things to improve ( no eating after 8, water intake 64oz, no fast food, walking 15 minutes a day) starting 02/15/24. She and sister are using a color coded calendar to be sure and show the successes.  Needs to eat breakfast to be successful - high protein breakfast like 1/2 avocado, 2 boiled eggs and sausage link (homemade). Said she needs to add more green veggies. Takes the weeks meals with her at work.   Member's Why:  in a year under 200 lbs and off of medication and more physically active and more business     Ht./Wt./BMI:     There is no height or weight on file to calculate BMI.   Motivation Level:8   Briefly summarize the coaching/education you provided during this call:  Interventions addressed/Education Provided: Used Motivational Interviewing techniques to explore the the member's readiness to change Provided reinforcement for positive changes or efforts made by the member  Reviewed progress on previous SMART goals (if applicable) Addressed barriers to change  Supported and promoted self-efficacy Educated member on healthy cooking habits, Enc. member to drink at least 64 oz. of water a day (discussed the importance of water on overall health), and Reinforced the importance of exercise and physical activity on overall health  Establish SMART goal by the 2nd call: GOAL_STATUS: NEW GOAL SET Mbr. will focus on her 30 day challenge (no eating after 8, water intake 64oz, no fast food, walking 15 minutes a day)  through 03/09/24 and check in next call on success.  Confidence Level: 1  Barriers (if less than 8): none  (If confidence number is less than 8): has to complete it to measure her confidence   Referred to Member Engagement Platform/Mobile App.   Plan for next call  (Follow up on health gaps/barriers identified on this interaction, [facility] Focus to be discussed  on next call):  Topic of Discussion Next Call: 30 day challeng- next, veggies and breakfast Follow-up with member on SMART Goal set on today's call  Review weight/goals (if applicable) Follow-up MEP/MAH and/or Mobile app registration and messaging  ??Cross-Role Handoff Quick Check (Complete Each Item):  Member Health Status: [x]  Poor []  Fair []  Good []  Very Good []  Excellent  MI Questions (must be completed annually: [x]  Completed all 5 this call []  Partially completed (needs to be finished next call) []  Annual still due  PHQ2: [x]  Done []  Exclusion []  Not Due Today  Referrals:  [x]  N/A []  Given []  Follow-up Needed []  If referred to RN, for what? []  If referred to [facility], for what?  Told member about getting in touch with health coach: [x] Yes [] No          [1]  Current Outpatient Medications:    cetirizine  (ZyrTEC ) 10 MG tablet, Take 10 mg by mouth daily, Disp: , Rfl:    glipiZIDE  (GLUCOTROL ) 5 MG tablet, Take by mouth, Disp: , Rfl:    olmesartan -hydrochlorothiazide  (BENICAR  HCT) 40-25 mg tablet, Take 1 tablet by mouth, Disp: , Rfl:    VITAMIN/SUPPLEMENT, Name: Mervin Masters ., Disp: , Rfl:

## 2024-02-26 ENCOUNTER — Other Ambulatory Visit (HOSPITAL_COMMUNITY): Payer: Self-pay

## 2024-02-26 ENCOUNTER — Other Ambulatory Visit: Payer: Self-pay

## 2024-02-26 MED ORDER — CETIRIZINE HCL 10 MG PO CHEW
10.0000 mg | CHEWABLE_TABLET | Freq: Every day | ORAL | 0 refills | Status: AC
  Filled 2024-02-26 (×2): qty 72, 72d supply, fill #0

## 2024-02-27 ENCOUNTER — Other Ambulatory Visit (HOSPITAL_COMMUNITY): Payer: Self-pay

## 2024-02-27 MED ORDER — CETIRIZINE HCL 10 MG PO TABS
10.0000 mg | ORAL_TABLET | Freq: Every day | ORAL | 3 refills | Status: AC
  Filled 2024-02-27: qty 90, 90d supply, fill #0

## 2024-02-29 ENCOUNTER — Other Ambulatory Visit: Payer: Self-pay

## 2024-02-29 ENCOUNTER — Other Ambulatory Visit (HOSPITAL_COMMUNITY): Payer: Self-pay

## 2024-03-07 ENCOUNTER — Other Ambulatory Visit (HOSPITAL_COMMUNITY): Payer: Self-pay

## 2024-03-21 ENCOUNTER — Encounter (HOSPITAL_COMMUNITY): Payer: Self-pay | Admitting: Pharmacist

## 2024-03-21 ENCOUNTER — Other Ambulatory Visit (HOSPITAL_COMMUNITY): Payer: Self-pay

## 2024-03-21 MED ORDER — DAPAGLIFLOZIN PROPANEDIOL 5 MG PO TABS
5.0000 mg | ORAL_TABLET | Freq: Every day | ORAL | 0 refills | Status: AC
  Filled 2024-03-21: qty 90, 90d supply, fill #0

## 2024-03-22 ENCOUNTER — Other Ambulatory Visit (HOSPITAL_COMMUNITY): Payer: Self-pay

## 2024-03-28 ENCOUNTER — Other Ambulatory Visit (HOSPITAL_COMMUNITY): Payer: Self-pay

## 2024-03-28 ENCOUNTER — Other Ambulatory Visit: Payer: Self-pay

## 2024-03-28 MED ORDER — CLINDAMYCIN PHOSPHATE 1 % EX SWAB
1.0000 | Freq: Two times a day (BID) | CUTANEOUS | 0 refills | Status: AC
  Filled 2024-03-28: qty 60, 30d supply, fill #0

## 2024-03-29 ENCOUNTER — Other Ambulatory Visit: Payer: Self-pay

## 2024-03-29 ENCOUNTER — Other Ambulatory Visit (HOSPITAL_COMMUNITY): Payer: Self-pay

## 2024-03-29 MED ORDER — FLUTICASONE FUROATE-VILANTEROL 100-25 MCG/ACT IN AEPB
1.0000 | INHALATION_SPRAY | Freq: Every day | RESPIRATORY_TRACT | 0 refills | Status: AC
  Filled 2024-03-29: qty 60, 60d supply, fill #0

## 2024-03-29 MED ORDER — JARDIANCE 25 MG PO TABS
25.0000 mg | ORAL_TABLET | Freq: Every day | ORAL | 0 refills | Status: AC
  Filled 2024-03-29 (×3): qty 30, 30d supply, fill #0

## 2024-03-30 ENCOUNTER — Other Ambulatory Visit: Payer: Self-pay

## 2024-03-30 ENCOUNTER — Other Ambulatory Visit (HOSPITAL_COMMUNITY): Payer: Self-pay
